# Patient Record
Sex: Female | Born: 1975
Health system: Western US, Academic
[De-identification: ages and names within clinical notes are randomized; demographics above are authoritative.]

## PROBLEM LIST (undated history)

## (undated) DIAGNOSIS — Z23 Encounter for immunization: Secondary | ICD-10-CM

## (undated) DIAGNOSIS — M5020 Other cervical disc displacement, unspecified cervical region: Secondary | ICD-10-CM

## (undated) DIAGNOSIS — F0781 Postconcussional syndrome: Secondary | ICD-10-CM

## (undated) DIAGNOSIS — F32A Depression, unspecified: Secondary | ICD-10-CM

## (undated) DIAGNOSIS — F419 Anxiety disorder, unspecified: Secondary | ICD-10-CM

## (undated) HISTORY — DX: Depression, unspecified: F32.A

## (undated) HISTORY — DX: Anxiety disorder, unspecified: F41.9

---

## 2012-01-18 MED ORDER — AMITRIPTYLINE HCL 25 MG PO TABS
25 MG | ORAL_TABLET | Freq: Every evening | ORAL | Status: DC
Start: 2012-01-18 — End: 2012-01-19

## 2012-01-18 NOTE — Patient Instructions (Addendum)
cyanocobalamine or B12 2000 mcg sublingual daily    Early dumping syndrome -- Early dumping syndrome has a rapid onset, usually within 15 minutes. It is the result of rapid emptying of food into the small bowel. Due to the hyperosmolality of the food, rapid fluid shifts from the plasma into the bowel occur, resulting in hypotension and a sympathetic nervous system response. Patients often present with colicky abdominal pain, diarrhea, nausea, and tachycardia [124].    Patients should avoid foods that are high in simple sugar content and replace them with a diet consisting of high fiber, complex carbohydrate, and protein rich foods. Behavioral modification, such as small, frequent meals, and separating solids from liquid intake by 30 minutes, are also advocated

## 2012-01-18 NOTE — Progress Notes (Deleted)
Rhonda Mcguire is a 36 y.o. female presenting to establish care.  Complaints today include    Was treated by urgent care for vaginitis. Treated with rocephin.  2 days of bactrim  Review of Systems - comprehensive review of systems negative except as noted in HPI    Allergies   Allergen Reactions   ??? Nsaids      Gastric bypass surgery       Past Medical History   Diagnosis Date   ??? Renal calculi        Past Surgical History   Procedure Laterality Date   ??? Cesarean section         Family History   Problem Relation Age of Onset   ??? Asthma Mother    ??? Cancer Father        History     Social History   ??? Marital Status: Married     Spouse Name: N/A     Number of Children: N/A   ??? Years of Education: N/A     Occupational History   ??? Not on file.     Social History Main Topics   ??? Smoking status: Never Smoker    ??? Smokeless tobacco: Never Used   ??? Alcohol Use: 0.0 oz/week   ??? Drug Use: No   ??? Sexually Active: Not on file     Other Topics Concern   ??? Not on file     Social History Narrative   ??? No narrative on file         Current Outpatient Prescriptions on File Prior to Visit   Medication Sig Dispense Refill   ??? Etonogestrel-Ethinyl Estradiol (NUVARING VA) Place  vaginally.       ??? Multiple Vitamin (MULTI-VITAMIN DAILY PO) Take  by mouth.         No current facility-administered medications on file prior to visit.       Filed Vitals:    01/18/12 1307   BP: 122/82   Pulse: 72         Wt Readings from Last 3 Encounters:   01/18/12 170 lb 12.8 oz (77.474 kg)     BP Readings from Last 3 Encounters:   01/18/12 122/82       Gen: well nourished, comfortable  Nose: Normal mucosa, no deformity   Ears: normal canals and TM bilaterally  Mouth/Throat: Oropharynx is clear and moist, and mucous membranes are normal. Normal dentition, no concerning lesions  Eyes: Conjunctivae and extraocular motions are normal. Pupils are equal, round, and reactive to light.  Fundi well visualized bilaterally with no abnormalities.  Optic discs with clear  sharp borders.   Neck: Neck supple. Carotid bruit is not present. No mass and no thyromegaly present.   Lymph: no cervical or supraclavicular adenopathy  Cardiovascular: Normal rate, regular rhythm, normal heart sounds and intact distal pulses.  Exam reveals no gallop and no friction rub.  No murmur heard.  Pulmonary/Chest: Effort normal and breath sounds normal. .  no wheezes, rhonchi or rales.   Abdominal: Soft, non-tender. Bowel sounds and aorta are normal.  no organomegaly, mass or bruit.  No bruits    Musculoskeletal: Normal range of motion, no synovitis.  Neurological: alert and oriented to person, place, and time. normal reflexes. CN 2-12 grossly intact   Coordination normal.   Skin: Skin is warm and dry. There is no rash or erythema.  No suspicious lesions noted.   Psychiatric: normal mood and affect. speech is normal and  behavior is normal. Judgment, cognition and memory are normal.     {Recent labs:19471::"not applicable"}    Assessment and Plan  No diagnosis found.

## 2012-01-19 MED ORDER — AMITRIPTYLINE HCL 25 MG PO TABS
25 MG | ORAL_TABLET | Freq: Every evening | ORAL | Status: DC
Start: 2012-01-19 — End: 2012-02-20

## 2012-01-19 NOTE — Telephone Encounter (Signed)
Per verbal order physician

## 2012-01-19 NOTE — Telephone Encounter (Signed)
Pt said the migraine medication was to be sent to [facility]  [phone]

## 2012-01-19 NOTE — Progress Notes (Signed)
Rhonda Mcguire is a 36 y.o. female presenting to establish care.  Complaints today include    Vaginal pruritus- evaluated urgent care [place] 1 week ago. Treated with rocephin, bactrim.  Used topical treatment at home for yeast.  Pruritus not as bad as it was. No dysuria. No vaginal discharge. No increased urinary frequency.  Found out her husband was cheating.     Diarrhrea, diaphoresis, nausea abdominal cramping- notices it within 15 minutes after eating.  Notices tachycardia too when this happens. S/p rouen Y gastric bypass 2 years ago. Has noticed it more past several months.  Notices if she eats too many carbs or ice cream.  Episodes are brief, but uncomfortable. Does have normal stools at other times. No abdominal pain. No vomiting.  Saw another physician.  Normal labs including TSH, cmp, cbc, vitamin D.  However B12 a little low.  She has not been getting the B12 injections she used to receive.    Depression- she does have some difficulty sleeping, concentrating since finding out about her husband's cheating.  Treated for depression in past. Not interested in medication.  In couple's counseling. Denies HI/SI.  lexapro only medication that ever worked for her.    Headache: Patient presents with headaches. Symptoms began about 10 years ago. Generally, the headaches last about 4 hours and occur several times per week. The headache do not seem to be related to any time of the day. The headaches are usually severe and pounding and are temporal in location.  The patient rates his most severe headaches a 7 on a scale from 1 to 10. Recently, the headaches are stable. School attendance or other daily activities are not affected by the headaches. Precipitating factors include stress. The headaches are usually not preceded by an aura. Associated neurologic symptoms which are present include: decreased physical activity and depression. The patient denies dizziness, loss of balance, muscle weakness, numbness of  extremities, speech difficulties, vision problems and vomiting in the early morning. Other associated symptoms include: nothing pertinent.  Symptoms which are not present include: fever. Home treatment has included maxalt melt with good improvement. Other history includes: nothing pertinent. Family history includes no known family members with significant headaches.      Review of Systems - comprehensive review of systems negative except as noted in HPI    Allergies   Allergen Reactions   ??? Nsaids      Gastric bypass surgery       Past Medical History   Diagnosis Date   ??? Renal calculi    ??? Depression    ??? Gastric bypass status for obesity      rouen-Y 2011   ??? Migraine        Past Surgical History   Procedure Laterality Date   ??? Cesarean section     ??? Roux-en-y gastric bypass         Family History   Problem Relation Age of Onset   ??? Asthma Mother    ??? Cancer Father        History     Social History   ??? Marital Status: Married     Spouse Name: N/A     Number of Children: 1   ??? 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 Smoking status: Never Smoker    ??? Smokeless tobacco: Never Used   ??? Alcohol Use: 0.0 oz/week   ???  Drug Use: No   ??? Sexually Active: Not on file     Other Topics Concern   ??? Not on file     Social History Narrative   ??? No narrative on file         Current Outpatient Prescriptions on File Prior to Visit   Medication Sig Dispense Refill   ??? Etonogestrel-Ethinyl Estradiol (NUVARING VA) Place  vaginally.       ??? Multiple Vitamin (MULTI-VITAMIN DAILY PO) Take  by mouth.       ??? amitriptyline (ELAVIL) 25 MG tablet Take 1 tablet by mouth nightly. To prevent migraines  30 tablet  1     No current facility-administered medications on file prior to visit.       Filed Vitals:    01/18/12 1307   BP: 122/82   Pulse: 72         Wt Readings from Last 3 Encounters:   01/18/12 170 lb 12.8 oz (77.474 kg)     BP Readings from Last 3 Encounters:   01/18/12 122/82        Gen: well nourished, comfortable  Nose: Normal mucosa, no deformity   Ears: normal canals and TM bilaterally  Mouth/Throat: Oropharynx is clear and moist, and mucous membranes are normal. Normal dentition, no concerning lesions  Eyes: Conjunctivae and extraocular motions are normal. Pupils are equal, round, and reactive to light.  Fundi well visualized bilaterally with no abnormalities.  Optic discs with clear sharp borders.   Neck: Neck supple. Carotid bruit is not present. No mass and no thyromegaly present.   Lymph: no cervical or supraclavicular adenopathy  Cardiovascular: Normal rate, regular rhythm, normal heart sounds and intact distal pulses.  Exam reveals no gallop and no friction rub.  No murmur heard.  Pulmonary/Chest: Effort normal and breath sounds normal. .  no wheezes, rhonchi or rales.   Abdominal: Soft, non-tender. Bowel sounds and aorta are normal.  no organomegaly, mass or bruit.  No bruits    Musculoskeletal: Normal range of motion, no synovitis.  Neurological: alert and oriented to person, place, and time. normal reflexes. CN 2-12 grossly intact   Coordination normal.   Skin: Skin is warm and dry. There is no rash or erythema.  No suspicious lesions noted.   Psychiatric: normal mood and affect. speech is normal and behavior is normal. Judgment, cognition and memory are normal.     Lab from prior provider reviewed    Assessment and Plan  1. Flu vaccine need  Flu Vaccine greater than or = 38yo IM   2. Need for Tdap vaccination  Tdap vaccine greater than or equal to 7yo IM   3. Vaginitis  CANCELED: Miscellaneous sendout 1, CANCELED: C. trachomatis / N. gonorrhoeae, DNA probe   4. Migraine     5. B12 deficiency     6. S/P gastric bypass     7. Depression     8. Status post gastric bypass for obesity       [facility]   Contacted.  They were unable to locate urine culture results, wet prep. Urine gc/chlamydia, triple screen vaginal probe collected and sent. Discussed need for RPR, HIV  testing at f/u appt    SL B12 2000 mcg daily for vit B12 def    Advised individual counseling as well as couples for her depression.  She declines pharmacotherapy    amitryptiline 25 mg nightly for prophylaxis.  maxalt as abortive.  Use no more than twice weekly.  Avoid use of any analgesic more than twice weekly to prevent rebound HA's.    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 Behavioral modification, such as small, frequent meals, and separating solids from liquid intake by 30 minutes, are also advocated     F/u 1 mth.  Will need RPR, HIV, repeat B12.    Obtain copy of pap For women

## 2012-01-20 LAB — VAGINAL PATHOGENS PROBE *A
Candida Species, DNA Probe: NEGATIVE
Gardnerella Vaginalis, DNA Probe: NEGATIVE
Trichomonas Vaginalis DNA: NEGATIVE

## 2012-02-05 NOTE — Telephone Encounter (Signed)
Records received for women

## 2012-02-20 NOTE — Progress Notes (Signed)
Rhonda Mcguire is a 36 y.o. female presenting today with c/o    Depression- she does have some difficulty sleeping, concentrating since finding out about her husband's cheating. Treated for depression in past. Not interested in medication. In couple's counseling. Denies HI/SI. lexapro only medication that ever worked for her. Husband had been unfaithful. Urine STD screen neg.  Has not had blood work.  Not interested in medication at this point.    Headaches- much less frequent.  Did not tolerate amitriptyline. Using maxalt less than once weekly.    Low B12- blood work performed and scanned by former provider.  Taking otc B12    Review of Systems - comprehensive review of systems negative except as noted in HPI    Allergies   Allergen Reactions   ??? Nsaids      Gastric bypass surgery       Past Medical History   Diagnosis Date   ??? Renal calculi    ??? Depression    ??? Gastric bypass status for obesity      rouen-Y 2011   ??? Migraine        Past Surgical History   Procedure Laterality Date   ??? Cesarean section     ??? Roux-en-y gastric bypass         Family History   Problem Relation Age of Onset   ??? Asthma Mother    ??? Cancer Father        History     Social History   ??? Marital Status: Married     Spouse Name: N/A     Number of Children: 1   ??? 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 Smoking status: Never Smoker    ??? Smokeless tobacco: Never Used   ??? Alcohol Use: 0.0 oz/week   ??? Drug Use: No   ??? Sexually Active: Not on file     Other Topics Concern   ??? Not on file     Social History Narrative   ??? No narrative on file         Current Outpatient Prescriptions on File Prior to Visit   Medication Sig Dispense Refill   ??? Etonogestrel-Ethinyl Estradiol (NUVARING VA) Place  vaginally.       ??? Multiple Vitamin (MULTI-VITAMIN DAILY PO) Take  by mouth.         No current facility-administered medications on file prior to visit.       Filed Vitals:    02/20/12 0743   BP: 144/82    Pulse: 88         Wt Readings from Last 3 Encounters:   02/20/12 169 lb 3.2 oz (76.749 kg)   01/18/12 170 lb 12.8 oz (77.474 kg)     BP Readings from Last 3 Encounters:   02/20/12 144/82   01/18/12 122/82         BP 130/82   Pulse 88   Wt 169 lb 3.2 oz (76.749 kg)   BMI 28.61 kg/m2  General appearance: alert, appears stated age and cooperative  Lungs: clear to auscultation bilaterally  Heart: regular rate and rhythm, S1, S2 normal, no murmur, click, rub or gallop  Abdomen: soft, non-tender; bowel sounds normal; no masses,  no organomegaly  Neurologic: Alert and oriented X 3, normal strength and tone. Normal symmetric reflexes. Normal coordination and gait  Skin: Skin is warm and  dry. .   Psychiatric: normal mood and affect. speech is normal and behavior is normal. Judgment, cognition and memory are normal.     not applicable    Assessment and Plan  1. B12 deficiency  VITAMIN B12   2. Screen for STD (sexually transmitted disease)  RPR, HIV-1 AND HIV-2 ANTIBODIES     Check B12 today.  If ok, continue 2000 mcg SL daily    Call directly on cell at [phone] with results    F/u 1 year

## 2012-02-20 NOTE — Progress Notes (Signed)
Are you taking prescription, herbal, or over the counter medications to help with your mood ?  No    Are you having any side effects with your medication?  No    If on medications, are you taking your medications daily?  Yes    Are you working with a psychologist / psychiatrist?  No    Have you felt your symptoms are better, worse, or unchanged since your last visit   unchanged    Do you want to discuss changes to your treatment? Yes

## 2012-02-21 LAB — RPR: RPR: NONREACTIVE

## 2012-02-21 LAB — VITAMIN B12: Vitamin B-12: 532 pg/mL (ref 211–911)

## 2012-02-21 LAB — HIV SCREEN: HIV-1/HIV-2 Ab: NONREACTIVE

## 2012-06-24 NOTE — Other (Unsigned)
[facility] Emergency Department ED PATIENT ENCOUNTER ARRIVAL: 06/24/12   1059 CSN: [id number]     HAR: [id number]             EMERGENCY DEPARTMENT - General NOTE     CHIEF COMPLAINT Chief Complaint Patient presents with   Syncope   Facial   Injury     HPI The nurses note was reviewed, agreed and appreciated.     Rhonda Mcguire is a 37 year old female with a past medical history   significant for gastric bypass presents with a one-day history of   lightheadedness and ataxia which resulted in a fasting standing height causing   acute contusion to the nasal bone and frontal forehead     Patient denies any recent history of fever, chills, nausea vomiting she   denies any chest pain or shortness of breath denies abdominal pain flank   tenderness dysuria change in her bowel bladder     She complains only of some recent lightheadedness which caused her to be   unsteady of gait resulting in a fall and injury to her nose with point of   injury epistaxis which is now hemostatic.     On exam she is otherwise well-nourished well-developed 37 year old female GCS   of 15 hemodynamically stable in mild distress secondary to fall from standing   height with injury to her nasal bone with epistaxis as described above     PAST MEDICAL HISTORY No past medical history on file.     SURGICAL HISTORY Past Surgical History Procedure Laterality Date   Gastric   bypass   C-section     CURRENT MEDICATIONS Prior to Admission medications Medication Sig Start Date   End Date Taking? Authorizing Provider Etonogestrel-Ethinyl Estradiol (NUVARING   VA) Insert  vaginally.   Yes Med, Historical Multiple Vitamins-Minerals   (MULTIVITAMIN PO) Take  by mouth.   Yes Med, Historical rizatriptan   (MAXALT-MLT) 10 MG TBDP Take 10 mg by mouth once as needed.   Yes Med,   Historical     ALLERGIES Allergies no known allergies     FAMILY HISTORY No family history on file.     SOCIAL HISTORY History     Social History   Marital Status: Married   Spouse  Name: N/A   Number of   Children: N/A   Years of Education: N/A     Social History Main Topics   Smoking status: Never Smoker   Smokeless   tobacco: None   Alcohol Use: No   Drug Use: No   Sexually Active: None     Other Topics Concern   None     Social History Narrative   None     REVIEW OF SYSTEMS Constitutional:  Denies fever, chills, weight loss or   weakness Eyes:  Denies photophobia or discharge HENT:  Denies sore throat or   ear pain Respiratory:  Denies cough or shortness of breath Cardiovascular:    Denies chest pain, palpitations or swelling GI:  Denies abdominal pain,   nausea, vomiting, or diarrhea Musculoskeletal: Nose and frontal forehead pain   secondary to fall from standing height Skin:  Denies rash Neurologic:   Complains of headache and generalized weakness but no other significant   sensory changes Endocrine:  Denies polyuria or polydypsia Lymphatic:  Denies   swollen glands Psychiatric:  Denies depression, suicidal ideation or homicidal   ideation All systems negative except as marked.     PHYSICAL EXAM VITAL  SIGNS: BP 138/89   Pulse 87   Temp(Src) 98.4  F (36.9  C)     Resp 16   SpO2 98%   LMP 06/20/2012 Constitutional:  Well developed, Well   nourished, No acute distress, Non-toxic appearance. HENT:  Normocephalic,   Atraumatic, Bilateral external ears normal, the TMs are clear and pearly   white, Oropharynx moist, No oral exudates, Nose normal. Neck- Normal range of   motion, No tenderness, Supple, No stridor. Eyes:  PERRL, EOMI, Conjunctiva   normal, No discharge,  No injection and no evidence of icterus Respiratory:     Lungs are clear to auscultation bilaterally, no adventitious sounds are   appreciated and there is good equal rise and fall with chest Cardiovascular:     Regular rate and rhythm, S1-S2 without murmurs gallops or rubs GI:  Bowel   sounds normal, Soft, No tenderness, No masses, No pulsatile masses. GU:   Deferred Musculoskeletal:  Intact distal pulses, mild generalized edema  to the   bridge of the nose secondary to fall from standing height with epistaxis and   cutaneous abrasion. Integument:  Warm, Dry, No erythema, No rash. Lymphatic:    No lymphadenopathy noted. Neurologic:  Alert & oriented x 3, Normal motor   function, Normal sensory function, No focal deficits noted. Psychiatric:    Affect normal, Judgment normal, Mood normal.     LABORATORY Recent Results (from the past 24 hour(s)) ECG 12-LEAD  Collection   Time   06/24/12 11:38 AM     Ellarie Picking Value Range  RR INTERVAL 625  PR INTERVAL   200  QRSD INTERVAL 70  QT INTERVAL 360  QTC INTERVAL 455  HEART RATE 96  P   AXIS 62  QRS AXIS 15  T WAVE AXIS 46  I:40 AXIS -13  T:40 AXIS 12  ST AXIS -5    EKG IMPRESSION - BORDERLINE ECG -  INTERPRETING PHYS  STUDY DATE / TIME   2012-06-24 11:38:11 CBC WITH DIFFERENTIAL  Collection Time   06/24/12 11:47 AM       Kendarrius Tanzi Value Range  WBC 10.3  3.6 - 10.5 THOU/mcL  RBC 5.15  3.80 - 5.20   MIL/mcL  HEMOGLOBIN 11.7 (*) 12.0 - 15.2 g/dL  HEMATOCRIT 36  36 - 46 %  MCV   71 (*) 82 - 97 fL  MCH 23 (*) 27 - 33 pg  MCHC 32  32 - 36 g/dL  RDW 09.815.2    <11.9<15.3 %  PLATELET 352  140 - 375 THOU/mcL  ABS. NEUTROPHIL 7.88 (*) 1.80 -   7.70 THOU/mcL  ABS LYMPHS 1.90  1.00 - 4.00 THOU/mcL  ABS MONOS 0.44  0.20 -   0.90 THOU/mcL  ABS EOS 0.05  0.03 - 0.45 THOU/mcL  ABS BASOS 0.04  0.01 - 0.07   THOU/mcL  SEGS 76  LYMPHOCYTES 19  MONOCYTES 4  EOSINOPHIL 1  BASOPHILS 0   PROTIME & INR  Collection Time   06/24/12 11:47 AM     Deontray Hunnicutt Value Range    PROTIME 10.0  9.0 - 11.4 Seconds  INR 1.0  0.8 - 1.2 PTT  Collection Time     06/24/12 11:47 AM     Jenin Birdsall Value Range  PTT 24.5  23.0 - 32.5 Seconds CK AND   MB FRACTION  Collection Time   06/24/12 11:47 AM     Stokes Rattigan Value Range  CPK   55  0 - 200 IU/L  CPK-MB 0.5  <  6.1 ng/mL  CK-MB INDEX    <5  Value: Index is   not calculated when the CPK is less than 150. POCT TROPONIN - ISTAT    Collection Time   06/24/12 11:51 AM     Gilmar Bua Value Range  TROPONIN 0.00    0.00 - 0.05 ng/mL  POCT METABOLIC PANEL - ISTAT  Collection Time   06/24/12   11:53 AM     Jorma Tassinari Value Range  GLUCOSE 93  74 - 99 mg/dL  BLD UREA NITROGEN   12  8 - 26 mg/dL  CREATININE 0.7  0.5 - 1.2 mg/dL  SODIUM 562  130 - 865 mEq/L    POTASSIUM 4.5  3.6 - 5.1 mEq/L  CHLORIDE 107  101 - 111 mEq/L  CO2 TOTAL 26    24 - 36 mEq/L  IONIZED CALCIUM 4.7  4.6 - 5.4 mg/dL  ANION GAP 14  6 - 18    HEMOGLOBIN,CALC 13.3  12.0 - 15.0 g/dL  HEMATOCRIT 39  36 - 47 %PCV     RADIOLOGY XR CHEST PA AND LATERAL  Final Maddyn Lieurance:   There are no acute   cardiopulmonary findings.         CT HEAD WO CONTRAST  Final Tomoya Ringwald:      1.  No acute intracranial hemorrhage.  2. Extracranial soft tissue contusion   right frontal region. No underlying calvarial fracture.  3. Nondisplaced,   non-distracted, fracture involving the anterior tip of the nasal bone with   minimal inferior angulation.         XR NASAL BONES  Final Mar Zettler:      Fracture tip of nasal bone, anteriorly         ED COURSE & MEDICAL DECISION MAKING      On exam she is otherwise well-nourished well-developed 37 year old female   GCS of 15 hemodynamically stable in mild distress secondary to fall from   standing height with injury to her nasal bone with epistaxis as described   above     Laboratory studies demonstrated no specific etiology of her symptoms     Noncontrast head CT demonstrated no acute intrapulmonary process     X-ray studies of the nasal bone demonstrated a fracture of the distal tip of   the nasal bone     Patient will be discharged home with prescription pain medications to manage   ongoing pain and nausea additionally she'll be instructed to follow up with   her primary care provider as well as the ENT specialist for which she will be   referred to     I discussed the plan with the patient who is in agreement and will follow the   discharge instructions as outlined above. I have answered all of their   questions and left the department ambulatory and in good condition.     The  patient was given the following medication in the Emergency department:     Medication Administration from 06/24/2012 1059 to 06/24/2012 1322      Date/Time Order Dose Route Action Action by Comments   06/24/2012 1253   morphine injection 4 mg 4 mg Intravenous Given Retta Diones   06/24/2012   1253 ondansetron (ZOFRAN) injection 4 mg 4 mg Intravenous Given Retta Diones         FINAL DIAGNOSIS     1. Closed fracture of nasal bone 2. Lightheadedness 3. Lack of coordination   4. Syncopal episodes  The patient was given the following medications to go home with.     New Prescriptions  ONDANSETRON (ZOFRAN) 4 MG TABS    Take 1 tablet by mouth   every 8 (eight) hours as needed.  OXYCODONE-ACETAMINOPHEN (PERCOCET) 5-325 MG   TABS    Take 1-2 tablets by mouth every 6 (six) hours as needed.     Follow-up Information  Follow up With Details Comments Contact Info  Abbott,   Cecile Hearing., MD Schedule an appointment as soon as possible for a visit in 3 days    [address] [facility] [address] [phone]      [facility]ena [facility] Th[facility] Call  [address] [place] [facility]   [id number] [phone]         Electronically signed by: Ivar Bury PA-C     Schrichten, Quita Skye., MD spent face-to-face time with the patient and agrees   with the management and disposition         Ivar Bury, [place] 06/24/12 1322         _________________________________  Signed by:    Ivar Bury    10    D: 06/24/2012 01:22 PM  T: 06/24/2012 01:22 PM    This document is confidential medical information.  Unauthorized disclosure or   use of this information is prohibited by law.  If you are not the intended recipient of this document, please advise [place] by   calling immediately [phone].

## 2012-06-24 NOTE — Other (Unsigned)
CURRENT STATUS IS EMERGENCY PENDING CARE MANAGEMENT DESIGNATION.     Any questions regarding MEDICAL RECORDS should be directed to Medical   Records: [facility] [phone] or [facility]   [phone]. Any questions regarding BILLING should be directed to the Care   Management Departments: [facility] [phone] or [facility] [phone].         _________________________________  Signed byJamison Neighbor:    [facility]  NOTIFY    ZZ    D: 06/24/2012 10:59 AM  T:    This document is confidential medical information.  Unauthorized disclosure or   use of this information is prohibited by law.  If you are not the intended recipient of this document, please advise us by   calling immediately [phone].

## 2012-06-24 NOTE — Other (Unsigned)
EMERGENCY DEPARTMENT - ATTENDING NOTE     I did examine the patient's face.     I have personally seen and examined this patient. I have fully participated   in the care of this patient. I have reviewed and agree with all pertinent   clinical information including history, physical exam, labs, radiographic   studies and the plan. I have also reviewed and agree with the medications,   allergies and past medical history sections for this patient.         Electronically signed by:     Quita SkyeJames D. Schrichten, MD         Laurence SlateSchrichten, James D., MD 06/24/12 1537         _________________________________  Signed by:    Quita SkyeJAMES D. SCHRICHTEN    SC    D: 06/24/2012 03:37 PM  T: 06/24/2012 03:37 PM    This document is confidential medical information.  Unauthorized disclosure or   use of this information is prohibited by law.  If you are not the intended recipient of this document, please advise us by   calling immediately [phone].

## 2012-06-27 NOTE — Progress Notes (Signed)
 CHIEF COMPLAINT: Larey Seat, struck face    HISTORY OF PRESENT ILLNESS:  37 y.o. female who struck her face 4 days ago.  Seen at ED [facility]  No nasal obstruction.  No diplopea.  Dental occlusion unchanged.  No smoking.      PAST MEDICAL HISTORY:   History   Smoking status   . Never Smoker    Smokeless tobacco   . Never Used                                                    History   Alcohol Use   . 0.0 oz/week                                                  Current outpatient prescriptions:oxyCODONE-acetaminophen (PERCOCET) 7.5-325 MG per tablet, Take 1 tablet by mouth every 4 hours as needed for Pain., Disp: , Rfl: ;  Etonogestrel-Ethinyl Estradiol (NUVARING VA), Place  vaginally., Disp: , Rfl: ;  Multiple Vitamin (MULTI-VITAMIN DAILY PO), Take  by mouth., Disp: , Rfl:                                                  Past Medical History   Diagnosis Date   . Renal calculi    . Depression    . Gastric bypass status for obesity      rouen-Y 2011   . Migraine                                                     Past Surgical History   Procedure Laterality Date   . Cesarean section     . Roux-en-y gastric bypass             PHYSICAL EXAMINATION:   GENERAL: wdwn- no acute distress  COMMUNICATION :  Normal voice  MENTAL STATUS:  Normal  HEAD AND FACE:  Left orbital hematoma  EXTERNAL EARS AND NOSE:  Healing laceration left nasal dorsum  FACIAL MUSCLES:  Normal  EXTRAOCULAR MUSCLES: Intact.  Left subconjuctival hemorrhage  FACE PALPATION:  Zygomatic arches and orbital rims intact  OTOSCOPY:  Normal tympanic membranes and middle ear spaces  TUNING FORKS: Rinne ++ Weber midline at 512 Hz  INTRANASAL:  Septum midline, no intranasal hematomata, turbinates normal, meati clear.  LIPS, TEETH, GINGIVA:  Normal mucosa  PHARYNX:  Normal  NECK:  No masses or adenopathy  SALIVARY GLANDS:  Normal  THYROID:  Normal  CRANIAL NERVES:  All trigeminal branches intact bilateral.  IMAGING REVIEWED:  Minimal displaced nasal tip  fracture    IMPRESSION: Facial trauma.  No displaced fracture.      PLAN: Reassured. No intervention indicated    FOLLOW-UP: prn

## 2012-06-27 NOTE — Patient Instructions (Signed)
 You may follow up as needed

## 2012-06-27 NOTE — Progress Notes (Signed)
Rhonda Mcguire is a 37 y.o. female presenting today with c/o    Was in the bath tub Monday morning.  Stood up and felt dizzy, passing out striking her face on the commode.  Still having left sided HA's. Difficulty concentrating in class.  Taking percocet for the HA's.  Hx of migraines.  CT, facial xray from ER reviewed. Nasal fracture.  No nausea or vomiting. No confusion.  Just left sided HA, dizziness, more so when she tries to hard to concentrate.  Has not tried her migraine medication for her HA.  No runny nose, no ear drainage. Sleeping ok. No irritability. No loss of balance or vertigo.  Bright lights bother her HA also.      Review of Systems - comprehensive review of systems negative except as noted in HPI    Allergies   Allergen Reactions   ??? Nsaids      Gastric bypass surgery       Past Medical History   Diagnosis Date   ??? Renal calculi    ??? Depression    ??? Gastric bypass status for obesity      rouen-Y 2011   ??? Migraine        Past Surgical History   Procedure Laterality Date   ??? Cesarean section     ??? Roux-en-y gastric bypass         Family History   Problem Relation Age of Onset   ??? Asthma Mother    ??? Cancer Father        History     Social History   ??? Marital Status: Married     Spouse Name: N/A     Number of Children: 1   ??? 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 Smoking status: Never Smoker    ??? Smokeless tobacco: Never Used   ??? Alcohol Use: 0.0 oz/week   ??? Drug Use: No   ??? Sexually Active: Not on file     Other Topics Concern   ??? Not on file     Social History Narrative   ??? No narrative on file         Current Outpatient Prescriptions on File Prior to Visit   Medication Sig Dispense Refill   ??? Etonogestrel-Ethinyl Estradiol (NUVARING VA) Place  vaginally.       ??? Multiple Vitamin (MULTI-VITAMIN DAILY PO) Take  by mouth.         No current facility-administered medications on file prior to visit.       Filed Vitals:    06/27/12 1139   BP:  130/80   Pulse: 80         Wt Readings from Last 3 Encounters:   06/27/12 174 lb (78.926 kg)   02/20/12 169 lb 3.2 oz (76.749 kg)   01/18/12 170 lb 12.8 oz (77.474 kg)     BP Readings from Last 3 Encounters:   06/27/12 130/80   02/20/12 130/82   01/18/12 122/82         BP 130/80   Pulse 80   Wt 174 lb (78.926 kg)   BMI 29.42 kg/m2  General appearance: alert, appears stated age and no distress  Eyes: left lateral subconjunctival hemorrhage. Ecchymosis left face, eye.  PERRLA  Ears: normal TM's and external ear canals both ears  Nose: swollen left side with bruising, slight lateral displacement of  distal nose. No obvious septal hematoma on inspection  Lungs: clear to auscultation bilaterally  Heart: regular rate and rhythm, S1, S2 normal, no murmur, click, rub or gallop  Neurologic: Alert and oriented X 3, normal strength and tone. Normal symmetric reflexes. Normal coordination and gait  Skin: Skin is warm and dry. Marland Kitchen   Psychiatric: normal mood and affect. speech is normal and behavior is normal. Judgment, cognition and memory are normal.     not applicable    Assessment and Plan  1. Nasal fracture  [place]- Sheria Lang, MD   2. Post-concussion headache     3. Subconjunctival hemorrhage, traumatic       Discussed HA, decreased concentration common with concussion  Note given for school.  Mental and physical rest  Advised judicious use of the percocet.  Would try maxalt for HA component with hx of migraines  Expect resolution of sx typically within 7-10 days, although post concussive syndrome possible  Will f/u next week. If still with HA's consider prophylaxis for migraines  appt arranged with Dr. Milda Smart today. [facility] referral initiated.

## 2012-07-01 MED ORDER — COENZYME Q10 100 MG PO CHEW
100 MG | ORAL_TABLET | ORAL | Status: AC
Start: 2012-07-01 — End: ?

## 2012-07-01 MED ORDER — MAGNESIUM OXIDE 400 MG PO TABS
400 MG | ORAL_TABLET | Freq: Every day | ORAL | Status: DC
Start: 2012-07-01 — End: 2012-07-02

## 2012-07-01 MED ORDER — TOPIRAMATE 100 MG PO TABS
100 MG | ORAL_TABLET | Freq: Every evening | ORAL | Status: DC
Start: 2012-07-01 — End: 2012-10-17

## 2012-07-01 MED ORDER — RIBOFLAVIN 400 MG PO CAPS
400 MG | ORAL_CAPSULE | ORAL | Status: AC
Start: 2012-07-01 — End: ?

## 2012-07-01 MED ADMIN — ketorolac (TORADOL) injection 60 mg: 60 mg | INTRAVENOUS | @ 14:00:00 | NDC 00409379501

## 2012-07-01 NOTE — Progress Notes (Signed)
Rhonda Mcguire is a 37 y.o. female presenting today with c/o    Hx of migraines in past 4 years.  Stress is trigger. Can occur 4 times weekly when stressed.   She has scotoma.  Start unilateral and then become bilateral. Last 3 hours.  Struck her face during syncopal  Episode 1 week ago. Has had facial pain and HA's since.   Typically has at least 1 headache weekly.  Squeezing, pounding.  Lying in dark room, maxalt help.  Sometimes nausea. No vomiting.  Photophobia, phonophobia.  Can't take ibuprofen due to hx of gastric bypass.  Was seen in ER. Distal nasal fracture. Followed up by ENT-no treatment needed.  Denies improvement in sx since last week when event occurred. In school and unable to concentrate due to worsening HA's since concussion 1 week ago. Hx of depression, but denies currently.     Review of Systems - comprehensive review of systems negative except as noted in HPI    Allergies   Allergen Reactions   ??? Nsaids      Gastric bypass surgery       Past Medical History   Diagnosis Date   ??? Renal calculi    ??? Depression    ??? Gastric bypass status for obesity      rouen-Y 2011   ??? Migraine        Past Surgical History   Procedure Laterality Date   ??? Cesarean section     ??? Roux-en-y gastric bypass         Family History   Problem Relation Age of Onset   ??? Asthma Mother    ??? Cancer Father        History     Social History   ??? Marital Status: Married     Spouse Name: N/A     Number of Children: 1   ??? 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 Smoking status: Never Smoker    ??? Smokeless tobacco: Never Used   ??? Alcohol Use: 0.0 oz/week   ??? Drug Use: No   ??? Sexually Active: Not on file     Other Topics Concern   ??? Not on file     Social History Narrative   ??? No narrative on file         Current Outpatient Prescriptions on File Prior to Visit   Medication Sig Dispense Refill   ??? Etonogestrel-Ethinyl Estradiol (NUVARING VA) Place  vaginally.       ??? Multiple  Vitamin (MULTI-VITAMIN DAILY PO) Take  by mouth.         No current facility-administered medications on file prior to visit.       Filed Vitals:    07/01/12 0853   BP: 130/82   Pulse: 78         Wt Readings from Last 3 Encounters:   07/01/12 174 lb (78.926 kg)   06/27/12 173 lb (78.472 kg)   06/27/12 174 lb (78.926 kg)     BP Readings from Last 3 Encounters:   07/01/12 130/82   06/27/12 128/66   06/27/12 130/80         BP 130/82   Pulse 78   Wt 174 lb (78.926 kg)   BMI 29.85 kg/m2   LMP 06/24/2012  General appearance: alert, appears stated age and no distress  Eyes: negative findings: lids and  lashes normal, corneas clear, pupils equal, round, reactive to light and accomodation and fundi normal, resolving subconjunctival hemorrhage right lateral eye  Ears: normal TM's and external ear canals both ears  Nose: turbinates normal, swelling resolved  Throat: lips, mucosa, and tongue normal; teeth and gums normal  Neck: no adenopathy, thyroid not enlarged, symmetric, no tenderness/mass/nodules and normal ROM, supple  Neurologic: Alert and oriented X 3, normal strength and tone. Normal symmetric reflexes. Normal coordination and gait  Skin: Skin is warm and dry. Marland Kitchen   Psychiatric: flat mood and affect. speech is normal and behavior is normal. Judgment, cognition and memory are normal.     not applicable    Assessment and Plan  1. Tension headache, chronic  [place]- Otelia Sergeant, MD    [place], [place]    CANCELED: [facility] - Contractor, Angelina Pih, MD   2. 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 Postconcussive syndrome  [place]- Otelia Sergeant, MD    [place], [place]    CANCELED: [facility] - Contractor, [place], MD     Reports long hx of migraines complicated by recent concussion and now, postconcussive syndrome.  Did not tolerate amitriptyline in past-SE of confusion    Agreeable  to trying topomax as prophylactic.  60 mg toradol IM given in office  Riboflavin, magnesium , CoQ, adequate hydration.  Will ask for help from neuro, Dr. Jason Fila. appt arranged in 3 weeks.    Not for school with post concussive syndrome. Mental and physical rest for now. May end up needing neurocognitive testing if sx persist    F/u 1 mth with as well. Denies depression, but always has flat affect

## 2012-07-01 NOTE — Patient Instructions (Addendum)
Riboflavin or vitamin B2 400 mg daily  Coenzyme Q 100 mg 3 times daily  Magnesium 400 mg daily

## 2012-07-18 MED ORDER — TIZANIDINE HCL 4 MG PO TABS
4 MG | ORAL_TABLET | Freq: Every evening | ORAL | Status: DC
Start: 2012-07-18 — End: 2012-12-12

## 2012-07-18 NOTE — Progress Notes (Signed)
The patient was referred by Su Monks, MD  for consultation regarding postconcussion syndrome, and daily headaches, and chronic migraine.    HPI:  The patient was exposed to a brief syncopal episode at home three weeks ago. At that time she was taking a shower and fell and hit the left side of her head. She blacked out for a few seconds. The patient was seen and evaluated at [facility] where she had CT scan of the head. She was discharged home. From the accident, she sustained mild nasal fracture and trauma.    Since that incident, she describes daily headaches. Her headaches are dull ache and pressure feeling that can affect her whole head. They range from moderate to mild. Her headaches are daily and persistent. No clear triggers. No nausea or vomiting. No weakness or numbness. She hasn't been taking any rescue medications for these headaches. She has history of migraines and she feels her current headache are different from her chronic migraine.    She describes her migraines as unilateral or bilateral pulsating and sharp pain. She can have them once a week. They last for few hours. She does describe visual aura with these migraines, photophobia, and phonophobia. No family history of migraine. No nausea or vomiting. No weakness or numbness. She takes Maxalt p.r.n. For rescue once a week.  A month ago she was started on Topamax and now she is up 100 mg daily. So far she denies any side effects from such medication. No trials of other preventative medicine. No sleep disturbance, memory loss, worsening depression. Other review of system was unremarkable.    Exam:  Constitutional: Weight: well-nourished.   Head: normocephalic and atraumatic.   Neck:  supple and no carotid bruits.   Heart: Rate And Rhythm: RRR. Murmurs: none.   Mental Status: Orientation oriented to person, place, problem, and time.   Mood/Affect: appropriate mood.   Memory: recent memory intact and remote memory intact.   Cranial  Nerves:  CN 2-12 wnl.   Motor Exam: normal tone, 5/5 strength in all 4 extremities.  Reflexes: Bilateral biceps 2/4, triceps 2/4, brachial radialis 2/4, knee 2/4 and ankle 2/4.   Planters: flexor bilaterally.  Coordination: no pronator drift, no dysmetria, FNF and HS testing within normal limits.  Sensation: normal to all modalities.  Gait/Posture: Normal    ROS: 14 points ROS reviewed with the patient which were normal except as mentioned in my HPI.    I personally reviewed social history, past medical history, medications, allergy, surgical history, and family history as documented in the patient's electronic health records.    Labs and test results: reviewed and discussed with the patient.     Assessment:  #1 post concussion syndrome.  #2 acute post traumatic headache.  #3 chronic migraine with aura.    Plan:  #1 change Topamax to 50 mg p.o. B.i.d.  #2 adequate hydration.  #3 start Zanaflex 4 mg at night for headache prevention. She tried amitriptyline in the past and had some side effects.  #4 continue with Maxalt for migraine only.  #5 no need to repeat neuroimaging.  #6 Will consider increasing Topamax to 75 g p.o. B.i.d. In the next 2-3 weeks.  #7 followup with me in 2 months.      Instructions:  1- The risk of rebound headaches were discussed.  2- Limit the use of any rescue medications to 2 days per week.  3- Limit Sodas and Coffee and increase hydration.  4- Migraine diary.  5- Improving sleep hygiene and avoid triggers.  6- The relation between headaches and mood disorders were addressed.  7- Preventive and rescue medications discussion.

## 2012-09-02 NOTE — Progress Notes (Signed)
History and Physical      Rhonda Mcguire  Date of Birth:  27-Feb-1976    Date of Service:  09/02/2012    Chief Complaint:   Rhonda Mcguire is a 37 y.o. female who presents for complete physical examination.    HPI: physical for PT assistant school    Wt Readings from Last 3 Encounters:   09/02/12 168 lb (76.204 kg)   07/18/12 171 lb (77.565 kg)   07/01/12 174 lb (78.926 kg)     BP Readings from Last 3 Encounters:   09/02/12 118/76   07/18/12 130/80   07/01/12 130/82       Patient Active Problem List   Diagnosis   ??? B12 deficiency   ??? Migraine   ??? Depression   ??? Status post gastric bypass for obesity   ??? Post concussion syndrome   ??? Acute post-traumatic headache   ??? Migraine with aura, with intractable migraine, so stated, without mention of status migrainosus   ??? Syncope and collapse       Preventive Care:  Health Maintenance   Topic Date Due   ??? Flu Vaccine Yearly (Adult)  12/16/2012   ??? Cervical Cancer Screening  11/17/2014   ??? Tetanus Vaccine Adult (11 Years And Up)  01/17/2022      Hx abnormal PAP: no  Good basic nutrition: yes  Self-breast exams: yes    Exercise: walks 4 time(s) per week  Seatbelt use: 100%  Lipid panel:   No results found for this basename: chol, trig, hdl, ldlcalc, ldldirect        Living will:  no,   patient declined    Immunization History   Administered Date(s) Administered   ??? Hepatitis A 03/26/1996   ??? Influenza Virus Vaccine 01/18/2012   ??? Meningococcal Conjugate 05/30/1999   ??? Tdap 01/18/2012       Allergies   Allergen Reactions   ??? Amitriptyline Other (See Comments)     confusion   ??? Nsaids      Gastric bypass surgery     Outpatient Prescriptions Marked as Taking for the 09/02/12 encounter (Office Visit) with Satira Mccallum Hektor Huston, MD   Medication Sig Dispense Refill   ??? Magnesium 400 MG CAPS Take  by mouth.       ??? vitamin B-12 (CYANOCOBALAMIN) 1000 MCG tablet Take 1,000 mcg by mouth daily.       ??? rizatriptan (MAXALT) 5 MG tablet Take 5 mg by mouth once as needed for Migraine. May repeat in 2  hours if needed       ??? tiZANidine (ZANAFLEX) 4 MG tablet Take 1 tablet by mouth nightly.  60 tablet  2   ??? Riboflavin 400 MG CAPS 1 tab po daily  30 capsule  0   ??? Coenzyme Q10 100 MG CHEW 1 tab po tid  90 tablet  0   ??? topiramate (TOPAMAX) 100 MG tablet Take 1 tablet by mouth nightly. To prevent migraines  30 tablet  1   ??? Etonogestrel-Ethinyl Estradiol (NUVARING VA) Place  vaginally.       ??? Multiple Vitamin (MULTI-VITAMIN DAILY PO) Take  by mouth.           Past Medical History   Diagnosis Date   ??? Renal calculi    ??? Depression    ??? Gastric bypass status for obesity      rouen-Y 2011   ??? Migraine      Past Surgical History   Procedure Laterality Date   ???  Cesarean section     ??? Roux-en-y gastric bypass       Family History   Problem Relation Age of Onset   ??? Asthma Mother    ??? Cancer Father      stomach     History     Social History   ??? Marital Status: Married     Spouse Name: N/A     Number of Children: 1   ??? Years of Education: N/A     Occupational History   ??? student-physical therapy assistant      Social History Main Topics   ??? Smoking status: Never Smoker    ??? Smokeless tobacco: Never Used   ??? Alcohol Use: 0.0 oz/week   ??? Drug Use: No   ??? Sexually Active: Not on file     Other Topics Concern   ??? Not on file     Social History Narrative   ??? No narrative on file       Review of Systems:  A comprehensive review of systems was negative except for what was noted in the HPI.     Physical Exam:   Filed Vitals:    09/02/12 0805   BP: 118/76   Pulse: 80   Height: 5\' 4"  (1.626 m)   Weight: 168 lb (76.204 kg)     Body mass index is 28.82 kg/(m^2).   Constitutional: She is oriented to person, place, and time. She appears well-developed and well-nourished. No distress.   HEENT:   Head: Normocephalic and atraumatic.   Right Ear: Tympanic membrane, external ear and ear canal normal.   Left Ear: Tympanic membrane, external ear and ear canal normal.   Nose: Nose normal.   Mouth/Throat: Oropharynx is clear and moist, and  mucous membranes are normal.  There is no cervical adenopathy.  Eyes: Conjunctivae and extraocular motions are normal. Pupils are equal, round, and reactive to light.   Neck: Neck supple. No JVD present. Carotid bruit is not present. No mass and no thyromegaly present.   Cardiovascular: Normal rate, regular rhythm, normal heart sounds and intact distal pulses.  Exam reveals no gallop and no friction rub.  No murmur heard.  Pulmonary/Chest: Effort normal and breath sounds normal. No respiratory distress. She has no wheezes, rhonchi or rales.   Abdominal: Soft, non-tender. Bowel sounds and aorta are normal. She exhibits no organomegaly, mass or bruit.   Genitourinary: performed by gynecologist.  Breast exam:  performed by specialist.  Musculoskeletal: Normal range of motion, no synovitis. She exhibits no edema.   Neurological: She is alert and oriented to person, place, and time. She has normal reflexes. No cranial nerve deficit. Coordination normal.   Skin: Skin is warm and dry. There is no rash or erythema.  No suspicious lesions noted.   Psychiatric: She has a normal mood and affect. Her speech is normal and behavior is normal. Judgment, cognition and memory are normal.         Lab Review: not applicable     Assessment/Plan:    Chundra was seen today for concussion.    Diagnoses and associated orders for this visit:    Routine physical examination  - RUBEOLA ANTIBODY IGG  - RUBELLA ANTIBODY, IGG  - MUMPS ANTIBODY, IGG  - VARICELLA ZOSTER ANTIBODY, IGG  - HEPATITIS B SURFACE ANTIBODY CLIENT  - Lipid panel  - Hepatitis A vaccine adult IM    Screening-pulmonary TB    Other Orders  - Mantoux testing  Commended regular exercise, good basic nutrition  She will work on living will via [employer]  Titers today, ppd. 2 step  Form completed for pick up following 2 nd step ppd read

## 2012-09-03 LAB — LIPID PANEL
Cholesterol, Total: 129 mg/dL (ref 0–199)
HDL: 69 mg/dL — ABNORMAL HIGH (ref 40–60)
LDL Calculated: 44 mg/dL (ref <100)
Triglycerides: 82 mg/dL (ref 0–150)
VLDL Cholesterol Calculated: 16 mg/dL

## 2012-09-03 LAB — RUBELLA ANTIBODY, IGG: Rubella Antibody IgG: 69.2 IU/mL

## 2012-09-04 LAB — MANTOUX TESTING: TB Skin Test: NEGATIVE

## 2012-09-04 LAB — HEPATITIS B SURFACE ANTIBODY CLIENT: Hep B S Ab: 16.37 m[IU]/mL

## 2012-09-04 LAB — RUBEOLA ANTIBODY IGG: Rubeola (Measles) Ab IgG: 227 AU/mL

## 2012-09-04 LAB — VARICELLA ZOSTER ANTIBODY, IGG: Varicella Zoster Ab IgG: 925.4 IV

## 2012-09-04 LAB — MUMPS ANTIBODY, IGG: Mumps IgG: <5 AU/mL

## 2012-09-05 ENCOUNTER — Telehealth

## 2012-09-05 NOTE — Telephone Encounter (Signed)
Left message to call back

## 2012-09-05 NOTE — Telephone Encounter (Signed)
Please let her know her test results reveals she needs an additional MMR. Her mumps titer showed she wasn't still immune. She can get it when she comes back for next ppd.

## 2012-09-18 LAB — MANTOUX TESTING
Induration: 0
TB Skin Test: NEGATIVE

## 2012-09-18 NOTE — Telephone Encounter (Signed)
The 2nd form is [employer] because she was going to have 2nd ppd done at [facility].  Rhonda Mcguire has the form

## 2012-09-18 NOTE — Telephone Encounter (Signed)
Patient came in for PPD and MMR. She said that she gave the forms to the office at [facility] for them to fill out. Please call patient when form is completed. She had a slight local reaction to MMR.

## 2012-09-19 NOTE — Telephone Encounter (Signed)
Pt advised and husband will be picking the form up

## 2012-09-19 NOTE — Telephone Encounter (Signed)
Called pt to let her know that the form was ready to pick up in [place] Form is in envelope with pt name on it

## 2012-10-17 NOTE — Progress Notes (Signed)
The patient came today for follow up regarding postconcussion syndrome, and daily headaches, and chronic migraine.    HPI:  Since her last visit, she feels her headaches are better. No more daily headaches. She takes Zanaflex at night and half tab of Topamax at night. She denies any neck pain, weakness or numbness.   She takes Maxalt for migraines. Her migraines are now are once a week instead of daily. No sleep disturbance, memory loss, worsening depression. Other review of system was unremarkable.    Exam:  Constitutional: Weight: well-nourished.   Head: normocephalic and atraumatic.   Neck:  supple and no carotid bruits.   Heart: Rate And Rhythm: RRR. Murmurs: none.   Mental Status: Orientation oriented to person, place, problem, and time.   Mood/Affect: appropriate mood.   Memory: recent memory intact and remote memory intact.   Cranial Nerves:  CN 2-12 wnl.   Motor Exam: normal tone, 5/5 strength in all 4 extremities.  Reflexes: Bilateral biceps 2/4, triceps 2/4, brachial radialis 2/4, knee 2/4 and ankle 2/4.   Coordination: no pronator drift, no dysmetria  Sensation: normal to all modalities.  Gait/Posture: Normal    ROS: 14 points ROS reviewed with the patient which were normal except as mentioned in my HPI.    I personally reviewed social history, past medical history, medications, allergy, surgical history, and family history as documented in the patient's electronic health records.    Labs and test results: reviewed and discussed with the patient.     Assessment:  #1 post concussion syndrome.  #2 acute post traumatic headache.  #3 chronic migraine with aura. better    Plan:  #1 CW with the same dose of Topamax  #2 adequate hydration.  #3 CW Zanaflex 4 mg at night for headache prevention.  #4 continue with Maxalt for migraine only.  #5 RTC in 6 month.      Instructions:  1- The risk of rebound headaches were discussed.  2- Limit the use of any rescue medications to 2 days per week.  3- Limit Sodas and  Coffee and increase hydration.  4- Migraine diary.  5- Improving sleep hygiene and avoid triggers.  6- The relation between headaches and mood disorders were addressed.  7- Preventive and rescue medications discussion.

## 2012-11-19 MED ORDER — TOPIRAMATE 100 MG PO TABS
100 MG | ORAL_TABLET | ORAL | Status: DC
Start: 2012-11-19 — End: 2013-06-03

## 2012-11-19 NOTE — Telephone Encounter (Signed)
Per physician verbal order

## 2012-12-12 ENCOUNTER — Encounter

## 2012-12-12 MED ORDER — TIZANIDINE HCL 4 MG PO TABS
4 MG | ORAL_TABLET | Freq: Every evening | ORAL | Status: DC
Start: 2012-12-12 — End: 2013-08-01

## 2012-12-12 NOTE — Telephone Encounter (Signed)
Last seen 07.03.14    Last office note states to RTO 6 months/01.2015.    Next appointment scheduled for 01.15.2015.

## 2013-06-03 MED ORDER — TOPIRAMATE 100 MG PO TABS
100 MG | ORAL_TABLET | Freq: Every evening | ORAL | Status: AC
Start: 2013-06-03 — End: ?

## 2013-06-03 NOTE — Progress Notes (Signed)
The patient came today for follow up regarding postconcussion syndrome and chronic migraine.    HPI:  Since her last visit, she denies any new symptoms today. Her migraines are infrequent and can be 1-2 per month. She takes Topamax 50 mg at night. She denies any SE from such medication. She is not using OTC. She takes Zanaflex. She denies any neck pain, weakness or numbness.   She takes Maxalt for migraines for rescue. No sleep disturbance, memory loss, worsening depression. Other review of system was unremarkable.    Exam:  Constitutional: Weight: well-nourished.   Head: normocephalic and atraumatic.   Neck:  supple and no carotid bruits.   Heart: Rate And Rhythm: RRR. Murmurs: none.   Mental Status: Orientation oriented to person, place, problem, and time.   Mood/Affect: appropriate mood.   Memory: recent memory intact and remote memory intact.   Cranial Nerves:  CN 2-12 wnl.   Motor Exam: normal tone, 5/5 strength in all 4 extremities.  Reflexes: Bilateral biceps 2/4, triceps 2/4, brachial radialis 2/4, knee 2/4 and ankle 2/4.   Coordination: no pronator drift, no dysmetria  Sensation: normal to all modalities.  Gait/Posture: Normal    ROS: 14 points ROS reviewed with the patient which were normal except as mentioned in my HPI.    I personally reviewed social history, past medical history, medications, allergy, surgical history, and family history as documented in the patient's electronic health records.    Labs and test results: reviewed and discussed with the patient.     Assessment:  #1 post concussion syndrome.  #2 post traumatic headache.  #3 chronic migraine with aura. better    Plan:  #1 CW with the same dose of Topamax 50 mg at night. Refill   #2 adequate hydration.  #3 CW Zanaflex 4 mg at night for headache prevention.  #4 continue with Maxalt for migraine only.  #5 RTC in 6 month.      Instructions:  1- The risk of rebound headaches were discussed.  2- Limit the use of any rescue medications to 2 days  per week.  3- Limit Sodas and Coffee and increase hydration.  4- Migraine diary.  5- Improving sleep hygiene and avoid triggers.  6- The relation between headaches and mood disorders were addressed.  7- Preventive and rescue medications discussion.

## 2013-07-11 MED ORDER — GABAPENTIN 300 MG PO CAPS
300 MG | ORAL_CAPSULE | ORAL | Status: DC
Start: 2013-07-11 — End: 2013-07-17

## 2013-07-11 NOTE — Progress Notes (Signed)
Rhonda Mcguire is a 38 y.o. female presenting today with c/o    Shoulder Pain: Patient complaints of left shoulder pain. This is evaluated as a personal injury. The pain is described as aching, burning and shooting.  The onset of the pain was sudden, not related to any specific activity.  The pain occurs every day and is continuous.  Location is global, neck, trapezius, radiates down arm. . No history of dislocation. Symptoms are aggravated by any movement. . Symptoms are diminished by  Nothing..   Limited activities include: most. Hurts to do anything. . No stiffness is reported. 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 Has had neck pain.  7/10 pain intermittently last 3 days.  Crutches make it worse.  Taking tylenol for the pain. 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 Has been working with crutches.     Review of Systems - comprehensive review of systems negative except as noted in HPI    Allergies   Allergen Reactions   ??? Amitriptyline Other (See Comments)     confusion   ??? Nsaids      Gastric bypass surgery       Past Medical History   Diagnosis Date   ??? Renal calculi    ??? Depression    ??? Gastric bypass status for obesity      rouen-Y 2011   ??? Migraine        Past Surgical History   Procedure Laterality Date   ??? Cesarean section     ??? Roux-en-y gastric bypass         Family History   Problem Relation Age of Onset   ??? Asthma Mother    ??? Cancer Father      stomach       History     Social History   ??? Marital Status: Married     Spouse Name: N/A     Number of Children: 1   ??? Years of Education: N/A     Occupational History   ??? student-physical therapy assistant      Social History Main Topics   ??? Smoking status: Never Smoker    ??? Smokeless tobacco: Never Used   ??? Alcohol Use: 0.0 oz/week   ??? Drug Use: No   ??? Sexual Activity: Not on file     Other Topics Concern   ??? Not on file     Social History Narrative         Current Outpatient Prescriptions on File Prior to Visit    Medication Sig Dispense Refill   ??? topiramate (TOPAMAX) 100 MG tablet Take 0.5 tablets by mouth nightly.  90 tablet  3   ??? tiZANidine (ZANAFLEX) 4 MG tablet Take 1 tablet by mouth nightly.  90 tablet  2   ??? Magnesium 400 MG CAPS Take  by mouth.       ??? vitamin B-12 (CYANOCOBALAMIN) 1000 MCG tablet Take 1,000 mcg by mouth daily.       ??? rizatriptan (MAXALT) 5 MG tablet Take 5 mg by mouth once as needed for Migraine. May repeat in 2 hours if needed       ??? Riboflavin 400 MG CAPS 1 tab po daily  30 capsule  0   ??? Coenzyme Q10 100 MG CHEW 1 tab po tid  90 tablet  0   ??? Etonogestrel-Ethinyl Estradiol (NUVARING VA) Place  vaginally.       ??? Multiple  Vitamin (MULTI-VITAMIN DAILY PO) Take  by mouth.         No current facility-administered medications on file prior to visit.       Filed Vitals:    07/11/13 1327   BP: 154/90         Wt Readings from Last 3 Encounters:   07/11/13 162 lb 6.4 oz (73.664 kg)   06/03/13 164 lb (74.39 kg)   10/17/12 162 lb (73.483 kg)     BP Readings from Last 3 Encounters:   07/11/13 154/90   06/03/13 138/80   10/17/12 140/88         BP 140/90    Ht 5\' 4"  (1.626 m)    Wt 162 lb 6.4 oz (73.664 kg)    BMI 27.86 kg/m2     General appearance: alert, appears stated age and no distress  Neck: no adenopathy, thyroid not enlarged, symmetric, no tenderness/mass/nodules and normal ROM  Back: symmetric, no curvature. ROM normal. No CVA tenderness. mild tenderness to palpation left trapezius muscle  Pulses: 2+ and symmetric  Neurologic: Alert and oriented X 3, normal strength and tone. Normal symmetric reflexes. Normal coordination and gait   Msk: negative hawkins, neer. No signs of impingement. Full ROM both shoulders  Skin: Skin is warm and dry. Marland Kitchen   Psychiatric: normal mood and affect. speech is normal and behavior is normal. Judgment, cognition and memory are normal.       Assessment and Plan  1. 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 Already on muscle relaxer  Gabapentin  for neuropathic pain  Consider brachial plexus injury with crutch use for clinicals, consider cervical radiculopathy  F/u 3 weeks with me  - EMG; Future    2. Vision problems  Reports needs vision testing per neurologist on muscle relaxer  20/25 bilaterally in office today

## 2013-07-11 NOTE — Patient Instructions (Signed)
Call [phone] to [facility]

## 2013-07-13 NOTE — Other (Unsigned)
[facility] Emergency Department ED PATIENT ENCOUNTER ARRIVAL:   07/13/13 1708 CSN: [id number]     HAR: [id number] EMERGENCY DEPARTMENT - NECK   NOTE     CHIEF COMPLAINT Chief Complaint Patient presents with   Arm Problem   Pt has   had a burning sensation in her left arm for the last two weeks, getting worse,   seen her PCP on Friday et was given a prescription, medication not helping,   has an appointment 4/14 for nerve conduction study     HPI Rhonda Mcguire is a 38 year old female who presents With left arm pain.   Patient states the past 2 weeks she's been having a burning sensation which is   discharged in her left wrist on the volar aspect and radiating all the way up   to the left portion of her neck. It is worse with any form of movement. 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 She says are actually doing diagnostic procedures for carpal   tunnel syndrome and she felt that she had a positive test with this and that   is when the pain seems to have really started. She followed up with her   primary care physician and was placed on Neurontin for this and has been   taking it and is scheduled to have an EMG performed on the 14th of next month   however her pain has steadily increased over the past 2 weeks. She denies any   frank weakness with this headaches problems seeing talking or swallowing with   this denies any fevers or chills chest pain cough or shortness of breath. She   has not taken anything else for her discomfort as she says Tylenol does not   work for her and she had a gastric bypass preventing her from taking NSAIDs     REVIEW OF SYSTEMS See HPI for further details.  All other review of systems   otherwise negative.Marland Kitchen     PAST MEDICAL HISTORY No past medical history on file.     SURGICAL HISTORY Past Surgical History Procedure Laterality Date    Gastric   bypass   C-section     CURRENT MEDICATIONS Prior to Admission medications Medication Sig Start Date   End Date Taking? Authorizing Provider gabapentin (NEURONTIN) 300 MG CAPS Take   300 mg by mouth 3 (three) times daily. Yes Historical Med Etonogestrel-Ethinyl   Estradiol (NUVARING VA) Insert  vaginally.    Historical Med Multiple   Vitamins-Minerals (MULTIVITAMIN PO) Take  by mouth.    Historical Med   rizatriptan (MAXALT-MLT) 10 MG TBDP Take 10 mg by mouth once as needed.   Historical Med oxycodone-acetaminophen (PERCOCET) 5-325 MG TABS Take 1-2   tablets by mouth every 6 (six) hours as needed. 06/24/12 07/13/13  Riportella,   Bryson Ha, PA ondansetron (ZOFRAN) 4 MG TABS Take 1 tablet by mouth every 8   (eight) hours as needed. 06/24/12 07/13/13  Riportella, Bryson Ha, PA     ALLERGIES No Known Allergies     FAMILY HISTORY No family history on file.     SOCIAL HISTORY History     Social History   Marital Status: Married   Spouse Name: N/A   Number of   Children: N/A   Years of Education: N/A  Social History Main Topics   Smoking status: Never Smoker   Smokeless   tobacco: None   Alcohol Use: No   Drug Use: No   Sexual Activity: None     Other Topics Concern   None     Social History Narrative     PHYSICAL EXAM VITAL SIGNS: BP 129/65    Pulse 86    Temp(Src) 98.2  F (36.8    C) (Oral)    Resp 16    Ht 64" (162.6 cm)    Wt 162 lb (73.483 kg)    BMI   27.79 kg/m2      SpO2 100%    LMP 06/29/2013 Constitutional:  Well developed,   well nourished, no acute distress, non-toxic appearance Eyes:  PERRL,   conjunctiva normal HENT:  Normocephalic nontraumatic oropharynx clear neck   tender to palpation left trapezius no midline step-off deformities full range   of motion without meningismus lymphadenopathy or carotid bruits Respiratory:    No respiratory distress, normal breath sounds Cardiovascular:  Normal rate,   normal rhythm, no murmurs, no gallops, no rubs Musculoskeletal:  Left upper   extremity is nontender to  palpation exception being a positive Tinel sign to   her left wrist she has no bony deformity full range of motion of 5 out of 5   strength Integument:  Well hydrated, no rash Neurologic:  Cranial nerves II   through XII grossly intact radial median ulnar nerves grossly intact motor and   sensory         RADIOLOGY/PROCEDURES     XR CERVICAL SPINE AP LATERAL AND ODONTOID Final Calan Doren X-RAY  CERVICAL SPINE:     CLINICAL INDICATION: Neck pain L cervical radiculopathy     TECHNIQUE: 5 views of the cervical spine.     COMPARISON: None     FINDINGS: C7-T1 disc space is seen.     Vertebral body heights are maintained. Alignment is maintained.  Disc spaces   are maintained.     No fracture, dislocation, or other bone abnormality is identified.   Prevertebral soft tissues demonstrate no swelling.     IMPRESSION     Normal x-ray of the cervical spine.     Please note that this record was generated using voice recognition software.   If there are any questions about the content of this document, please contact   the radiologist at [phone] as some errors in transcription may have   occurred.             ED COURSE & MEDICAL DECISION MAKING Pertinent Labs & Imaging studies   reviewed. (See chart for details) Patient presents with what sounds to be   neuropathic pain to her left upper extremity difficult to determine if this is   indeed carpal tunnel syndrome are more of a cervical radiculopathy which I   favor given the fact she is having neck pain with it as well. X-ray of her   neck does not reveal any bony abnormality or spurring at this current point   and she subjectively is neurologically intact. She was given 2 Percocet here   with improvement of her pain will be given a small prescription for this along   with a Medrol Dosepak as an anti-inflammatory instructed to followup with EMG   as planned on the 14th. The patient was given the following medication in the   Emergency department:     Medication Administration  from 07/13/2013 1708 to 07/13/2013  1823      Date/Time Order Dose Route Action Action by Comments   07/13/2013 1740   oxycodone-acetaminophen (PERCOCET) 5-325 MG per tablet 2 tablet 2 tablet Oral   Given Caswell Corwin         FINAL DIAGNOSIS:     1. Radiculopathy, cervical         The patient was given the following medications to go home with.     New Prescriptions  METHYLPREDNISOLONE (MEDROL, PAK,) 4 MG KIT    Take as   directed  OXYCODONE-ACETAMINOPHEN (PERCOCET) 5-325 MG TABS    Take 1-2 tablets   by mouth every 6 (six) hours as needed.     Electronically signed by: Herby Abraham, MD         Herby Abraham, MD 07/13/13 1824         _________________________________  Signed by:  Bryon Lions    SH    D: 07/13/2013 06:24 PM  T: 07/13/2013 06:24 PM    This document is confidential medical information.  Unauthorized disclosure or   use of this information is prohibited by law.  If you are not the intended recipient of this document, please advise [place] by   calling immediately [phone].

## 2013-07-13 NOTE — Other (Unsigned)
CURRENT STATUS IS EMERGENCY .      :     Any questions regarding BILLING should be directed to the Care Management   Departments: [facility] [phone] or [facility]   [phone].         _________________________________  Signed byJamison Neighbor:    [facility]  NOTIFY    ZZ    D: 07/13/2013 05:08 PM  T:    This document is confidential medical information.  Unauthorized disclosure or   use of this information is prohibited by law.  If you are not the intended recipient of this document, please advise us by   calling immediately [phone].

## 2013-07-14 NOTE — Telephone Encounter (Signed)
Pt advised and appt made

## 2013-07-14 NOTE — Telephone Encounter (Signed)
Pt was seen in ER over the weekend  And the doctor there wrote a note for her to return to school on 4/3    She needs to return today but with no movement of left arm  Can Dr Louanna RawAbbott re write note to say this  Pt is faxing the original ER note to [facility]

## 2013-07-14 NOTE — Telephone Encounter (Signed)
If she does not move her left arm, she will end up with a frozen shoulder.  I will write it for limited movement. Please emphasize to her that if she avoids all movement, she will end up with more problems    Please have her schedule follow up with me in office several days after her EMG/nerve conduction studies that are scheduled on 4/14

## 2013-07-15 NOTE — Telephone Encounter (Signed)
Pt advised

## 2013-07-15 NOTE — Telephone Encounter (Signed)
Pt called said she wanted a phone consult with Dr Louanna RawAbbott regarding her arm  Advised I could make appt for pt to be seen, could not guarantee a phone consult  She made appt for 4/2 but if a phone consult can be done , please call pt

## 2013-07-15 NOTE — Telephone Encounter (Signed)
I would prefer to see her

## 2013-07-17 MED ORDER — GABAPENTIN 600 MG PO TABS
600 MG | ORAL_TABLET | ORAL | Status: DC
Start: 2013-07-17 — End: 2013-08-01

## 2013-07-17 NOTE — Progress Notes (Signed)
Rhonda Mcguire is a 38 y.o. female presenting today with c/o    Neck pain and left arm pain:  Started 10 days ago. Now 9/10 pain. Awakens at 3 am with pain.  Gabapentin was not helping enough. Went to ER 07/14/2013. Given oxycodone. That is what helps with her pain. Wants refill. Hx of refractory migraines for which she takes muscle relaxer nightly.   The pain is described as aching, burning and shooting. The onset of the pain was sudden, not related to any specific activity. The pain occurs every day and is continuous. Location is global, neck, trapezius, radiates down left  arm. . No history of dislocation. Symptoms are aggravated by extension, left rotation, left abduction of neck. Symptoms are diminished by narcotics. Limited activities include: most. Hurts to do anything. . No stiffness is reported. 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   Wants note for limited participation PT assistant clinicals.  Has EMG scheduled 07/29/2013.  Wants referral to pain management if examiner won't refill narcotics  Crying during visit  Denies depression  Review of Systems - comprehensive review of systems negative except as noted in HPI    Allergies   Allergen Reactions   ??? Amitriptyline Other (See Comments)     confusion   ??? Nsaids      Gastric bypass surgery       Past Medical History   Diagnosis Date   ??? Renal calculi    ??? Depression    ??? Gastric bypass status for obesity      rouen-Y 2011   ??? Migraine        Past Surgical History   Procedure Laterality Date   ??? Cesarean section     ??? Roux-en-y gastric bypass         Family History   Problem Relation Age of Onset   ??? Asthma Mother    ??? Cancer Father      stomach       History     Social History   ??? Marital Status: Married     Spouse Name: N/A     Number of Children: 1   ??? Years of Education: N/A     Occupational History   ??? student-physical therapy assistant      Social History Main Topics   ??? Smoking status: Never Smoker    ??? Smokeless tobacco: Never Used   ???  Alcohol Use: 0.0 oz/week   ??? Drug Use: No   ??? Sexual Activity: Not on file     Other Topics Concern   ??? Not on file     Social History Narrative         Current Outpatient Prescriptions on File Prior to Visit   Medication Sig Dispense Refill   ??? gabapentin (NEURONTIN) 300 MG capsule 1 cap po bid for 2 days, then tid  90 capsule  0   ??? topiramate (TOPAMAX) 100 MG tablet Take 0.5 tablets by mouth nightly.  90 tablet  3   ??? tiZANidine (ZANAFLEX) 4 MG tablet Take 1 tablet by mouth nightly.  90 tablet  2   ??? Magnesium 400 MG CAPS Take  by mouth.       ??? vitamin B-12 (CYANOCOBALAMIN) 1000 MCG tablet Take 1,000 mcg by mouth daily.       ??? rizatriptan (MAXALT) 5 MG tablet Take 5 mg by mouth once as needed for Migraine. May repeat in 2 hours if needed       ???  Riboflavin 400 MG CAPS 1 tab po daily  30 capsule  0   ??? Coenzyme Q10 100 MG CHEW 1 tab po tid  90 tablet  0   ??? Etonogestrel-Ethinyl Estradiol (NUVARING VA) Place  vaginally.       ??? Multiple Vitamin (MULTI-VITAMIN DAILY PO) Take  by mouth.         No current facility-administered medications on file prior to visit.       Filed Vitals:    07/17/13 0922   BP: 128/82   Pulse: 79         Wt Readings from Last 3 Encounters:   07/17/13 161 lb 12.8 oz (73.392 kg)   07/11/13 162 lb 6.4 oz (73.664 kg)   06/03/13 164 lb (74.39 kg)     BP Readings from Last 3 Encounters:   07/17/13 128/82   07/11/13 140/90   06/03/13 138/80         BP 128/82    Pulse 79    Ht 5\' 4"  (1.626 m)    Wt 161 lb 12.8 oz (73.392 kg)    BMI 27.76 kg/m2      Breastfeeding? No     General appearance: alert, appears stated age and cooperative  Eyes: negative findings: lids and lashes normal and conjunctivae and sclerae normal  Neck: no adenopathy and normal ROM. in neck and left trapezius with rotation to left, extension, abduction left. no radicular pain  Back: symmetric, no curvature. ROM normal. No CVA tenderness.  Extremities: extremities normal, atraumatic, no cyanosis or edema  Pulses: 2+ and  symmetric  Neurologic: Alert and oriented X 3, normal strength and tone. Normal symmetric reflexes. Normal coordination and gait  Skin: Skin is warm and dry. Marland Kitchen   Psychiatric: flat mood and affect. speech is normal and behavior is normal. Judgment, cognition and memory are normal.         Assessment and Plan  1. Radicular pain in left arm    2. Neck pain    Discussed with patient and husband treatment options including PT  Advised increase gabapentin for neuropathic pain as opposed to narcotics.    Given that patient has daily chronic HA"s for which she sees HA specialist and requires nightly muscle relaxer, would consider her high risk for chronic pain  MRI arranged for tomorrow, although exam normal. Discussed that I am ordering in case of referral to specialist/pain management  EMG scheduled 3/14 and f/u appt with me 3/17  Can call tomorrow early afternoon if not adequate relief from increased gabapentin. However, discussed that I would refill narcotic one time only

## 2013-07-17 NOTE — Patient Instructions (Addendum)
Keep your appointment as scheduled with me in 2 weeks  Increase your gabapentin to 600 mg 4 times daily  Call tomorrow early afternoon if not  Noticing improvement in pain

## 2013-07-18 MED ORDER — OXYCODONE-ACETAMINOPHEN 5-325 MG PO TABS
5-325 MG | ORAL_TABLET | Freq: Every day | ORAL | Status: DC
Start: 2013-07-18 — End: 2013-08-01

## 2013-07-18 NOTE — Telephone Encounter (Signed)
Please notify ready for pick up

## 2013-07-18 NOTE — Telephone Encounter (Signed)
Pt called said pt could call if she needed oxycodone and she is requesting it

## 2013-07-18 NOTE — Telephone Encounter (Signed)
[facility]

## 2013-07-21 ENCOUNTER — Telehealth

## 2013-07-21 NOTE — Telephone Encounter (Signed)
From: Tiburcio Peaarona Apsey  To: Su MonksSusan Brinkman Abbott, MD  Sent: 07/19/2013 6:25 AM EDT  Subject: Non-Urgent Medical Question    Dr. Louanna RawAbbott,  First I feel the need to apologize your staff on Friday because of the mixup on the medication script and thank them for stay a few minutes late so I could get that get that much needed medicine. I just wanted to remind you about my absorption issues because of my gastric bypass, I know I meant to bring that up again at my apt. and I forgot. 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 Sorry this was not a question. Thank you for your time, Tiburcio PeaCarona Schechter

## 2013-07-21 NOTE — Telephone Encounter (Signed)
Regarding: Non-Urgent Medical Question  Contact: [phone]  ----- Message from [name] sent at 07/21/2013  7:53 AM EDT -----       ----- Message from Rhonda Peaarona Dino to Su MonksSusan Brinkman Sergio Zawislak, MD sent at 07/19/2013  6:25 AM -----   Dr. Louanna RawAbbott,  First I feel the need to apologize your staff on Friday because of the mixup on the medication script and thank them for stay a few minutes late so I could get that get that much needed medicine. I just wanted to remind you about my absorption issues because of my gastric bypass, I know I meant to bring that up again at my apt. and I forgot. 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 Sorry this was not a question. Thank you for your time,  Rhonda PeaCarona Mcguire

## 2013-07-21 NOTE — Telephone Encounter (Signed)
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  I am referring her to a spinal surgeon, but hopefully she won't need surgery.  I want for her to still have the nerve conduction studies because he will want these done before he sees her.  Please see if she has scheduled her physical therapy with [facility] yet.  I want for her to proceed with this  Please see what works for her and schedule her an appointment with Dr. Jacqlyn LarsenSaul.  I have placed the referral. It will need processed-[facility]    Thanks

## 2013-07-22 NOTE — Telephone Encounter (Signed)
Pt said she got a message from Dr Louanna RawAbbott staff saying to schedule an appt with a Careers advisersurgeon.  I asked her if she was given name of one, she said she was told to call here.    2. having gabapentin issues, still in pain   She either needs to see Dr Louanna RawAbbott or have her refer to pain management  What do you want her to do?    3/  She wanted to advise she is having physical Therapy on Wed  4/8    Please call pt

## 2013-07-22 NOTE — Other (Unsigned)
[facility] Emergency Department ED PATIENT ENCOUNTER ARRIVAL:   07/22/13 1630 CSN: [id number]     HAR: [id number] EMERGENCY DEPARTMENT -    Extremity NOTE     CHIEF COMPLAINT Chief Complaint Patient presents with   Arm Pain     HPI Rhonda Mcguire is a 38 year old female who presents complaining of left   arm burning. She rates severity as 7/10, radiating from the left shoulder down   into the left wrist. No aggravating or relieving factors. She denies any   weakness. Patient was recently diagnosed with a herniated disc in her neck.   She is supposed to see a neurosurgeon, Dr. Wynetta Fines, on the 17th. She is currently   out of her medications. She is using a TENS unit with mild success. She   states the pain was under control with her previous medications which included   Percocet and a steroid Dosepak.     REVIEW OF SYSTEMS All other review of systems are negative except what   mentioned in the HPI.     PAST MEDICAL HISTORY Past Medical History Diagnosis Date   Unspecified   essential hypertension     SURGICAL HISTORY Past Surgical History Procedure Laterality Date   Gastric   bypass   C-section     CURRENT MEDICATIONS Prior to Admission medications Medication Sig Start Date   End Date Taking? Authorizing Provider tizanidine (ZANAFLEX) 4 MG CAPS Take  by   mouth.   Yes Historical Med oxycodone-acetaminophen (PERCOCET) 5-325 MG TABS   Take 1-2 tablets by mouth every 6 (six) hours as needed. 07/22/13  Yes Adamow,   Rebeca Allegra, MD methylPREDNISolone 4 MG KIT Use as directed 07/22/13  Yes   Adamow, Rebeca Allegra, MD gabapentin (NEURONTIN) 300 MG CAPS Take 300 mg by   mouth 3 (three) times daily. Yes Historical Med oxycodone-acetaminophen   (PERCOCET) 5-325 MG TABS Take 1-2 tablets by mouth every 6 (six) hours as   needed. 07/13/13  Yes Herby Abraham, MD methylPREDNISolone (MEDROL,   PAK,) 4 MG KIT Take as directed 07/13/13  Yes Herby Abraham, MD   Etonogestrel-Ethinyl Estradiol ([place]) Insert  vaginally.   Yes   Historical Med Multiple Vitamins-Minerals (MULTIVITAMIN PO) Take  by mouth.     Yes Historical Med rizatriptan (MAXALT-MLT) 10 MG TBDP Take 10 mg by mouth   once as needed.   Yes Historical Med     ALLERGIES No Known Allergies     FAMILY HISTORY No family history on file.     SOCIAL HISTORY History     Social History   Marital Status: Married   Spouse Name: N/A   Number of   Children: N/A   Years of Education: N/A     Social History Main Topics   Smoking status: Never Smoker   Smokeless   tobacco: None   Alcohol Use: Yes   Drug Use: No   Sexual Activity: None     Other Topics Concern   None     Social History Narrative     PHYSICAL EXAM VITAL SIGNS: BP 175/90    Pulse 100    Temp(Src) 98.6  F (37    C)    Resp 16    Ht 64" (162.6 cm)    Wt 162 lb (73.483 kg)    BMI 27.79 kg/m2        SpO2 100%    LMP 06/29/2013 Constitutional:  Well developed, well  nourished, no acute distress, non-toxic appearance HENT:  Atraumatic, external   ears normal, nose normal, oropharynx moist.  Neck- normal range of motion, no   tenderness, supple Respiratory:  No respiratory distress, normal breath   sounds. Cardiovascular:  Normal rate, normal rhythm, no murmurs, no gallops,   no rubs GI:  Soft, nondistended, normal bowel sounds, nontender   Musculoskeletal: Full range of motion of the left shoulder, left elbow left   wrist and left hand. Radial pulse 2+. Integument:  Well hydrated, no rash   Neurologic: Distal sensation intact to all digits of the left upper extremity,   5/5 strength including grip, wrist flexion extension, elbow flexion   extension.     ED COURSE & MEDICAL DECISION MAKING     Pertinent Labs & Imaging studies reviewed. (See chart for details)     Review of the record shows the patient has a bulging disc at C3-C4 which is   minimally impinging on the cord. She denies any weakness. She essentially   needs more pain medications to help control her symptoms until she sees her   neurosurgeon  on the 17th. She will be treated here with Dilaudid and sent home   with Percocet.     The patient was given the following medication in the Emergency department:     Medication Administration from 07/22/2013 1630 to 07/22/2013 1913  None         Patient was given the signs and symptoms of worsening condition and told to   return if they occurred.  Patient understands the plan and management and all   questions were answered.  Patient was given suspected diagnosis and other   possible etiology.  Patient was given discharge instructions.  Patient will   follow up with her primary care physician in the next 2-3 days.  Patient was   instructed on Culture results and preliminary radiology reads and that she   would be contacted if a variance or positive test is obtained.  The nursing   note was reviewed, agreed and appreciated.     Final Diagnosis:     1. Radiculopathy, cervical         The patient was given the following medications to go home with.     New Prescriptions  METHYLPREDNISOLONE 4 MG KIT    Use as directed    OXYCODONE-ACETAMINOPHEN (PERCOCET) 5-325 MG TABS    Take 1-2 tablets by mouth   every 6 (six) hours as needed.     Electronically signed by: Jolinda Croak, MD         Jolinda Croak, MD 07/22/13 1915         _________________________________  Signed by:  Jeryl Columbia ADAMOW    10    D: 07/22/2013 07:15 PM  T: 07/22/2013 07:15 PM    This document is confidential medical information.  Unauthorized disclosure or   use of this information is prohibited by law.  If you are not the intended recipient of this document, please advise [place] by   calling immediately [phone].

## 2013-07-22 NOTE — Other (Unsigned)
CURRENT STATUS IS EMERGENCY .      :     Any questions regarding BILLING should be directed to the Care Management   Departments: [facility] [phone] or [facility]   [phone].         _________________________________  Signed byJamison Neighbor:    [facility]  NOTIFY    ZZ    D: 07/22/2013 04:30 PM  T:    This document is confidential medical information.  Unauthorized disclosure or   use of this information is prohibited by law.  If you are not the intended recipient of this document, please advise us by   calling immediately [phone].

## 2013-07-22 NOTE — Telephone Encounter (Signed)
Referral faxed for dr Jacqlyn Larsensaul. appt made also. Pt informed

## 2013-07-24 NOTE — Telephone Encounter (Signed)
From: Rhonda Mcguire  To: Su MonksSusan Brinkman Abbott, MD  Sent: 07/23/2013 11:22 PM EDT  Subject: Non-Urgent Medical Question    Just wanted to let you know that on Monday night I visited an urgent care clinic in pain and was advised to take either more of what I was prescribed or go to the emergency room. I am having adverse reaction to the increased dose of gabapentin and I tried to convey that in my message the other day. The gabapentin makes very shaky and I have random muscle twitches almost like some who shows signs of Parkinson's disease. Also, my pain became so intense on Tuesday that I ended up at the emergency room. I would like to cutback on my gabapentin but am wondering who it will affect my pain. Thank for your time, Rhonda Mcguire

## 2013-07-25 ENCOUNTER — Telehealth

## 2013-07-25 NOTE — Telephone Encounter (Signed)
From: Tiburcio Peaarona Sanluis  To: Su MonksSusan Brinkman Abbott, MD  Sent: 07/24/2013 5:25 PM EDT  Subject: Non-Urgent Medical Question    I contacted the pain management clinic you suggested, the soonest and the first available appointment was April 21 and that was just for paperwork and consultation. They did not have my information so I assumed you had not put in a referral yet. I also hoped by that point in time either physical therapy would have helped resolve my problem or that the spinal surgeon would have decided how to proceed. I hear your concerns but I can't help the pain that I feel everyday and the side effects I'm having. Since my gastric bypass surgery my body does not seem to respond/absorb medicine like it should. I went to physical therapy yesterday and they put me on spinal traction and they had to stop the procedure because the pain starting shooting down my left and further down my spine. Today, I now have a burning sensation that is not only going down my left arm but now it is also burning down the center of my spine and occasionally I am also getting zingers down my right side. So I am in pain and wish the gabapentin was as affective as you would like it to be.

## 2013-07-25 NOTE — Telephone Encounter (Signed)
Spoke with pt she said that she didn't take that appt. Advised her that she needs to call them back and take the 4/21 appt. Referral [facility].

## 2013-07-25 NOTE — Telephone Encounter (Signed)
Please initiate [facility] referral pain management that I printed and make sure she actually took the 4/21 appointment with pain management please

## 2013-07-25 NOTE — Telephone Encounter (Signed)
Regarding: Non-Urgent Medical Question  Contact: [phone]  ----- Message from Shellee MiloKathryn Whitt, MA sent at 07/25/2013  8:02 AM EDT -----       ----- Message from Rhonda Peaarona Mcneill to Su MonksSusan Brinkman Alaja Goldinger, MD sent at 07/24/2013  5:25 PM -----   I contacted the pain management clinic you suggested, the soonest and the first available appointment was April 21 and that was just for paperwork and consultation. They did not have my information so I assumed you had not put in a referral yet. I also hoped by that point in time either physical therapy would have helped resolve my problem or that the spinal surgeon would have decided how to proceed.  I hear your concerns but I can't help the pain that I feel everyday and the side effects  I'm having. Since my gastric bypass surgery my body does not seem to respond/absorb medicine like it should. I went to physical therapy yesterday and they put me on spinal traction and they had to stop the procedure because the pain starting shooting down my left and further down my spine. Today, I now have a burning sensation that is not only going down my left arm but now it is also burning down the center of my spine and occasionally I am also getting zingers down my right side. So I am in pain and wish the gabapentin was as affective as you would like it to be.

## 2013-07-29 NOTE — Procedures (Signed)
[facility] [address]  [place], [place] [id number]  [phone]     Electrodiagnostic Medicine  Rehabilitation Services Department      Exam Date: 07/29/2013  Name: Rhonda Mcguire, Rhonda Exam Time: 12:25 PM  MR#: [id number] Patient ID: [id number]  Gender:  Date of Birth: September 28, 1975    Age: [age]    Referring Physician: Bridget HartshornS. Brinkman Abbott M.D.       Clinical Problem:   Patient is a 38 yo female with prior gastric bypass and migraines who complains of burning pain and paresthesias bilateral hands, worse past several mos.  She has occasional neck pain.  PE reveals fair strength throughout.  Reflexes are present and symmetric.     Summary:   1. NCS are within normal limits.  2. Needle EMG reveals polyphasic voluntary motor unit potentials with slight decreased recruitment within left C7 innervated muscles.  No muscle membrane irritability (sustained muscle denervation) is noted however.     Impression:   1. Probable mild left C7 radiculopathy.  Subacute reinnervation appears to be present in left C7 innervated muscles.  2. No evidence of a diffuse peripheral neuropathy, carpal tunnel syndrome or other focal mononeuropathy involving the upper extremities.    (Note:  If this report is being viewed under CHART REVIEW/PROCEDURES, table format can be better viewed using CHART REVIEW/NOTES/Note type: procedure)    Thank you.             Pansy Ostrovsky B. Hal Hopeichter, M.D.        Motor Nerve Conduction:            Nerve and Site   Lat  ms Amp  mV Segment   Dist  mm Lat Diff  ms CV   m/s   Median.R to Abductor pollicis brevis.R      Wrist 3.6 5.1 Abductor pollicis brevis-Wrist 80 3.6    Elbow 7.8 5.1 Wrist-Elbow 230 4.2 55   Ulnar.R to Abductor digiti minimi (manus).R      Wrist 3.0 9.7 Abductor digiti minimi (manus)-Wrist 80 3.0    Above elbow 7.3 9.4 Wrist-Above elbow 250 4.3 58   Median.L to Abductor pollicis brevis.L      Wrist 3.5 5.6 Abductor pollicis brevis-Wrist 80 3.5    Elbow 7.6 5.4 Wrist-Elbow 220 4.1 54   Ulnar.L to Abductor  digiti minimi (manus).L      Wrist 3.1 8.4 Abductor digiti minimi (manus)-Wrist 80 3.1    Above elbow 7.6 8.7 Wrist-Above elbow 240 4.5 53     Nerve Lat ? ms Amp ? mV CV ? m/s  Median 4.2 4 50  Ulnar 4.2 4 50  Peroneal 5.5 2 40  Tibial 6.1 4 40    F-waves:        Nerve   M-Lat  ms F-Lat  ms        Median.L  27.6      Sensory and Mixed Nerve Conduction:             Nerve and Site   Peak  Lat ms Amp  mV Segment   Lat Diff  ms Dist  mm   Median.R to Digit II (index finger).R      Wrist 3.4 29 Digit II (index finger)-Wrist  140   Ulnar.R to Digit V (little finger).R      Wrist 3.1 35 Digit V (little finger)-Wrist  140   Median.L to Digit II (index finger).L      Wrist 3.4 32 Digit II (index finger)-Wrist  140   Ulnar.L to Digit V (little finger).L      Wrist 3.1 27 Digit V (little finger)-Wrist  140     Nerve Peak Lat ? ms Amp ? ??V   Median 3.6 20  Ulnar 3.7 20  Radial 2.8 10  Sural 4.0 5  Peroneal 3.5 5    Needle EMG Examination:      Muscle Insertion Spontaneous Activity Volutional MUAPs Comments    Activity Fibs PSW Fasc Other Effort Recruit Dur Amp Poly     Biceps brachii.L Normal 0 0 None  Normal Normal Normal Normal None    Triceps brachii.L Normal 0 0 None  Normal Reduced Sl Incr Normal Few    Pronator teres.L Normal 0 0 None  Normal Normal Normal Normal None    Extensor indicis proprius.L Normal 0 0 None  Normal Reduced Sl Incr Normal Few    Abductor pollicis brevis.L Normal 0 0 None  Normal Normal Normal Normal None    1st dorsal interosseous.L Normal 0 0 None  Normal Normal Normal Normal None    Flexor carpi radialis.L Normal 0 0 None  Normal Reduced Sl Incr Normal Few    Cervical paraspinals: mid.L Normal 0 0 None          Biceps brachii.R Normal 0 0 None  Normal Normal Normal Normal None    Triceps brachii.R Normal 0 0 None  Normal Normal Normal Normal None    Pronator teres.R Normal 0 0 None  Normal Normal Normal Normal None    Extensor indicis proprius.R Normal 0 0 None  Normal Normal Normal Normal None     Abductor pollicis brevis.R Normal 0 0 None  Normal Normal Normal Normal None    1st dorsal interosseous.R Normal 0 0 None  Normal Normal Normal Normal None    Cervical paraspinals: .R Normal 0 0 None

## 2013-07-31 NOTE — Other (Unsigned)
CURRENT STATUS IS EMERGENCY .      :     Any questions regarding BILLING should be directed to the Care Management   Departments: [facility] [phone] or [facility]   [phone].         _________________________________  Signed byJamison Neighbor:    [facility]  NOTIFY    ZZ    D: 07/31/2013 07:07 PM  T:    This document is confidential medical information.  Unauthorized disclosure or   use of this information is prohibited by law.  If you are not the intended recipient of this document, please advise us by   calling immediately [phone].

## 2013-07-31 NOTE — Other (Unsigned)
------------------------------------------------------------------------------  -- Attestation signed by Barton Dubois., MD at 07/31/13 2221 EMERGENCY   DEPARTMENT - ATTENDING NOTE         I have personally seen and examined this patient. I have fully   participated in the care of this patient with the resident/MLP.  I have   reviewed and agree with all pertinent clinical information including history,   physical exam, labs, radiographic studies and the plan. I have also reviewed   and agree with the medications, allergies and past medical history sections   for this patient.     Electronically signed by: Newman Nip. Milinda Pointer, MD   ------------------------------------------------------------------------------  --     [facility] ED PATIENT ENCOUNTER ARRIVAL:   07/31/13 1907 CSN: [id number]     HAR: [id number]     EMERGENCY DEPARTMENT ENCOUNTER     CHIEF COMPLAINT: Chief Complaint Patient presents with   Neck Pain     HPI: Rhonda Mcguire is a 38 year old female who presents complaining of neck   pain. The neck pain has been going on for approaching a month.  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   Patient has a documented cervical herniated disc and is supposed to see Dr.   Rolanda Jay tomorrow. She's had previous emergency department visits because   of the severity of the pain and discomfort and has been referred on to   professional pain management by her primary care provider. Patient stated that   appointment with Dr. Wynetta Fines is tomorrow but pain management is not until the   end of the month. Movement makes the pain worse, and nothing seems to make it   better.  The patient rates the pain at a 7 out of 10.  The pain is described   as a lateral paracervical musculature with primarily left cervical   radiculopathy. Patient reports her recent dosing of prednisone seemed to help   the symptoms some in the Percocet has now run out.  There has been no loss of   control of bladder or bowels. Patient denies any other injuries, fever, chest   pain, dyspnea, vomiting, difficulty ambulating, or any other concerns.     REVIEW OF SYSTEMS: As in the HPI.  All other systems are reviewed and are   negative.     PAST MEDICAL HISTORY: Past Medical History Diagnosis Date   Unspecified   essential hypertension   Herniated cervical disc     SURGICAL HISTORY: Past Surgical History Procedure Laterality Date   Gastric   bypass   C-section     CURRENT MEDICATIONS: Prior to Admission medications Medication Sig Start Date   End Date Taking? Authorizing Provider topiramate (TOPAMAX) 25 MG TABS Take 50   mg by mouth nightly.   Yes Historical Med co-enzyme Q-10 50 MG CAPS Take 100   mg by mouth daily.   Yes Historical Med Cyanocobalamin (B-12 DOTS SL) by   Sublingual route daily.   Yes Historical Med tizanidine (ZANAFLEX) 4 MG CAPS   Take  by mouth.   Yes Historical Med gabapentin (NEURONTIN) 300 MG CAPS Take   600 mg by mouth 4 (four) times daily. Yes Historical Med   oxycodone-acetaminophen (PERCOCET) 5-325 MG TABS Take 1-2 tablets by mouth   every 6 (six) hours as needed. 07/13/13  Yes Herby Abraham, MD   Etonogestrel-Ethinyl Estradiol ([place]) Insert  vaginally.   Yes  Historical Med Multiple Vitamins-Minerals (MULTIVITAMIN PO) Take  by mouth.     Yes Historical Med rizatriptan (MAXALT-MLT) 10 MG TBDP Take 10 mg by mouth   once as needed.   Yes Historical Med oxycodone-acetaminophen (PERCOCET) 5-325   MG TABS Take 1-2 tablets by mouth every 6 (six) hours as needed. 07/22/13   07/31/13  Jolinda Croak, MD methylPREDNISolone 4 MG KIT Use as   directed 07/22/13 07/31/13  Adamow, Rebeca Allegra, MD methylPREDNISolone   (MEDROL, PAK,) 4 MG KIT Take as directed 07/13/13 07/31/13 Herby Abraham, MD     ALLERGIES: Allergies Allergen Reactions   Nsaids Other (See Comments)   DUE   TO GASTRIC BYPASS SURGERY     FAMILY HISTORY: No family history  on file. Noncontributory     SOCIAL HISTORY: History     Social History   Marital Status: Married   Spouse Name: N/A   Number of   Children: N/A   Years of Education: N/A     Social History Main Topics   Smoking status: Never Smoker   Smokeless   tobacco: None   Alcohol Use: Yes   Drug Use: No   Sexual Activity: None     Other Topics Concern   None     Social History Narrative     PHYSICAL EXAMINATION: VITAL SIGNS: BP 184/90    Pulse 98    Temp(Src) 99.4  F   (37.4  C) (Oral)    Resp 16    Ht 64" (162.6 cm)    Wt 162 lb (73.483 kg)      BMI 27.79 kg/m2      SpO2 100%    LMP 06/29/2013 Constitutional:   Well-developed, well-nourished, in no acute distress. HEENT: Atraumatic,   normocephalic, PERRLA; non-icteric and non-injected. Normal hearing. Normal   appearing nose. Moist mucous membranes. Lymphatic: No lymphangitis and/or   lymphedema. Respiratory: No respiratory distress. Lungs clear to auscultation   bilaterally. No wheezes, rhonchi or rhales. Cardiovascular: RRR, no murmurs,   gallops or rubs. GI: Abdomen soft, NT/ND. No rigidity, rebound, or guarding.   Normal bowel sounds. No CVA tenderness. Musculoskeletal: Patient has   tenderness in the left and right paracervical region, with increasing pain   with ROM of the neck. Able to move all extremities. No joint tenderness or   effusions. Patient demonstrates 5 out of 5 strength to shoulder abduction,   elbow flexion/extension, wrist flexion/extension, finger flexion/extension,   grip strength. Neurovascular integrity as documented intact. Skin: No rashes,   lesions, or lacerations. Neurologic: Alert and oriented x 3. No gross motor or   sensory deficits noted. Psychological: Tearful and anxious with no evidence   of depressed mood and affect and an excellent historian.     ED COURSE & MEDICAL DECISION MAKING: Pertinent laboratory and diagnostic   studies performed in the ED have been reviewed. (See EMR chart for full   details).     The patient was given the  following medication in the Emergency department:     Medication Administration from 07/31/2013 1907 to 07/31/2013 2007      Date/Time Order Dose Route Action Action by Comments   07/31/2013 1956   orphenadrine (NORFLEX) injection 60 mg 60 mg Intramuscular Given Sherri    Deaton per pt request, give on same side.   07/31/2013 1954 promethazine   (PHENERGAN) injection 25 mg 25 mg Intramuscular Given Sherri  Deaton given   with dilaudid   07/31/2013 1954  HYDROmorphone (DILAUDID) injection 1 mg 1 mg   Intramuscular Given Sherri  Deaton         Rhonda Mcguire is a 38 year old female who presents the the ED with neck   pain. We reviewed the patient's recent medical records. We're aware of her   referral onto [facility] as well as pain management. We have explained to her that   we would consider ongoing care and management of her neck pain here in the   emergency department today but that the emergency department was not the   location for ongoing care and management of what is developing into a cervical   condition which appears chronic. With that being said, she was medicated with   IM injections as above and will be discharged with a short course of   medications to get her through until she can be seen and evaluated by both   neurosurgery and pain management. I am not comfortable placing her back on a   prednisone dose pack today as she recently had one at the beginning of the   week which was marginal in its efficacy. There is no swelling, voice change,   stridor, carotid bruits, or neurologic deficits to suggest need for further   imaging or further emergency evaluation at this point.     The patient was given the following prescriptions to go home with.     New Prescriptions  METHOCARBAMOL (ROBAXIN) 750 MG TABS    Take 1 tablet by   mouth every 6 (six) hours as needed.  OXYCODONE-ACETAMINOPHEN (PERCOCET) 5-325   MG TABS    Take 1 tablet by mouth every 6 (six) hours as needed.     Disposition: Discharge to home      Final diagnosis: 1. Left cervical radiculopathy 2. Neck pain     Barton Dubois., MD provided face to face time with the patient today in   the emergency department and agrees with the above mentioned diagnosis,   prognosis and proposed treatment plan.     Please note that some or all of this record was generated using voice   recognition software. If there are any questions about the content of this   document, please contact the author as some errors in transcription may have   occurred.     Electronically signed by: Adele Barthel. Royetta Asal, PA         Fredderick Erb., PA 07/31/13 2007     Barton Dubois., MD 07/31/13 2221         _________________________________  Signed by:  Reeves Dam    D: 07/31/2013 10:21 PM  T: 07/31/2013 10:21 PM    This document is confidential medical information.  Unauthorized disclosure or   use of this information is prohibited by law.  If you are not the intended recipient of this document, please advise [place] by   calling immediately [phone].

## 2013-08-01 MED ORDER — TIZANIDINE HCL 4 MG PO TABS
4 MG | ORAL_TABLET | Freq: Three times a day (TID) | ORAL | Status: AC
Start: 2013-08-01 — End: ?

## 2013-08-01 MED ORDER — GABAPENTIN 600 MG PO TABS
600 MG | ORAL_TABLET | ORAL | Status: AC
Start: 2013-08-01 — End: ?

## 2013-08-01 NOTE — Progress Notes (Signed)
Rhonda Mcguire is a 38 y.o. female presenting today with c/o    Neck pain and left arm pain:  Started 20 days ago. Now 9/10 pain. Awakens at 3 am with pain. Gabapentin was not helping enough. Went to ER 07/14/2013. Given oxycodone.  07/31/2013 given oxycodone. That is what helps with her pain. Wants refill. Hx of refractory migraines for which she takes muscle relaxer nightly.  The pain is described as aching, burning and shooting. The onset of the pain was sudden, not related to any specific activity. The pain occurs every day and is continuous. Location is global, neck, trapezius, radiates down left arm. No some radiation to scalp and face.  Symptoms are aggravated by extension, left rotation, left abduction of neck. Symptoms are diminished by narcotics. Limited activities include: most. Hurts to do anything. . No stiffness is reported. 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   Went to 1 PT session. Has to use wright patt. Feels it's too far. Busy with school  Has appointment pain management May 1st.   Denies depression  Review of Systems - comprehensive review of systems negative except as noted in HPI    Allergies   Allergen Reactions   ??? Amitriptyline Other (See Comments)     confusion   ??? Nsaids      Gastric bypass surgery       Past Medical History   Diagnosis Date   ??? Renal calculi    ??? Depression    ??? Gastric bypass status for obesity      rouen-Y 2011   ??? Migraine    ??? Cervical disc herniation-C4-C5 MRI 07/2013 07/21/2013       Past Surgical History   Procedure Laterality Date   ??? Cesarean section     ??? Roux-en-y gastric bypass         Family History   Problem Relation Age of Onset   ??? Asthma Mother    ??? Cancer Father      stomach       History     Social History   ??? Marital Status: Married     Spouse Name: N/A     Number of Children: 1   ??? Years of Education: N/A     Occupational History   ??? student-physical therapy assistant      Social History Main Topics   ??? Smoking status: Never Smoker    ???  Smokeless tobacco: Never Used   ??? Alcohol Use: 0.0 oz/week   ??? Drug Use: No   ??? Sexual Activity: Not on file     Other Topics Concern   ??? Not on file     Social History Narrative         Current Outpatient Prescriptions on File Prior to Visit   Medication Sig Dispense Refill   ??? oxyCODONE-acetaminophen (PERCOCET) 5-325 MG per tablet Take 1 tablet by mouth daily. As needed for pain  15 tablet  0   ??? gabapentin (NEURONTIN) 600 MG tablet 1 tab po qid  120 tablet  0   ??? topiramate (TOPAMAX) 100 MG tablet Take 0.5 tablets by mouth nightly.  90 tablet  3   ??? tiZANidine (ZANAFLEX) 4 MG tablet Take 1 tablet by mouth nightly.  90 tablet  2   ??? Magnesium 400 MG CAPS Take  by mouth.       ??? vitamin B-12 (CYANOCOBALAMIN) 1000 MCG tablet Take 1,000 mcg by mouth daily.       ???  rizatriptan (MAXALT) 5 MG tablet Take 5 mg by mouth once as needed for Migraine. May repeat in 2 hours if needed       ??? Riboflavin 400 MG CAPS 1 tab po daily  30 capsule  0   ??? Coenzyme Q10 100 MG CHEW 1 tab po tid  90 tablet  0   ??? Etonogestrel-Ethinyl Estradiol ([place]) Place  vaginally.       ??? Multiple Vitamin (MULTI-VITAMIN DAILY PO) Take  by mouth.         No current facility-administered medications on file prior to visit.       Filed Vitals:    08/01/13 1518   Pulse: 105         Wt Readings from Last 3 Encounters:   08/01/13 158 lb (71.668 kg)   08/01/13 161 lb (73.029 kg)   07/17/13 161 lb 12.8 oz (73.392 kg)     BP Readings from Last 3 Encounters:   07/17/13 128/82   07/11/13 140/90   06/03/13 138/80         Pulse 105    Ht 5\' 4"  (1.626 m)    Wt 158 lb (71.668 kg)    BMI 27.11 kg/m2      SpO2 100%   General appearance: alert, appears stated age and cooperative  Neck: no adenopathy and thyroid not enlarged, symmetric, no tenderness/mass/nodules tenderness to palpation trapezius, neck paravertebral muscles  Back: symmetric, no curvature. ROM normal. No CVA tenderness.  Neurologic: Alert and oriented X 3, normal strength and tone. Normal  symmetric reflexes. Normal coordination and gait  Skin: Skin is warm and dry. Marland Kitchen.   Psychiatric: flat mood and affect. speech is normal and behavior is normal. Judgment, cognition and memory are normal.         Assessment and Plan  1. Cervical disc herniation-C4-C5 MRI 07/2013  Advised PT again  Continue gabapentin  Can increase zanaflax to tid for next 2 weeks only. Has required from [facility] specialist nightly chronically  Advised narcotics not treatment of choice  Advised pain management may choose not to prescribe these-app 5/1 pain management

## 2013-08-01 NOTE — Progress Notes (Signed)
History of present illness:   Ms. Rhonda Mcguire  is a 38 y.o. female , referred by Dr.Abbott for consultation regarding her neck pain and primarily left arm pain.    This patient states that her major complaint is severe pain in the posterior cervical muscles greater on the left side than the right along with left arm pain.    The patient is somewhat of a challenging historian.  She tends to relate her symptoms based on what other people have observed as opposed to what she is truly experiencing.  She is a physical therapy Location managertechnician student and relates a lot of her symptoms as being what her fellow students have told her is going on with her.  This makes it a little bit difficult to sort out the symptoms.    As best as we can get from her, approximately 4 weeks ago she began to experience a tightness in her posterior cervical muscles.  Subsequent to that she developed various types of pain and burning in the back of the neck but also in her left arm.  At one time she states it goes from her neck down her arm as one would expect with nerve root compression at other times she describes it going up from her fingers to her shoulder.    She sought medical help from her primary care physician who appropriately ordered an MRI scan.  She also ordered physical therapy.  The patient had 1 physical therapy session but then was told by her insurance carrier [employer]) that she must receive all of her treatment at [facility] [place].  Her husband is active Hotel managermilitary and they have the [employer] insurance.  Therefore she's had no further treatments.    She was referred here for evaluation.        The patient is referred for evaluation, opinions and recommendations.  Past medical history:  Her past medical history has been has been personally reviewed and reveals the following:  Past Medical History   Diagnosis Date   ??? Renal calculi    ??? Depression    ??? Gastric bypass status for obesity      rouen-Y 2011   ???  Migraine    ??? Cervical disc herniation-C4-C5 MRI 07/2013 07/21/2013        Past surgical history:  Her past surgical history has been personally reviewed and reveals the following:  Past Surgical History   Procedure Laterality Date   ??? Cesarean section     ??? Roux-en-y gastric bypass         Her medications and allergies were reviewed and reveal:  Current Outpatient Prescriptions   Medication Sig Dispense Refill   ??? oxyCODONE-acetaminophen (PERCOCET) 5-325 MG per tablet Take 1 tablet by mouth daily. As needed for pain  15 tablet  0   ??? gabapentin (NEURONTIN) 600 MG tablet 1 tab po qid  120 tablet  0   ??? topiramate (TOPAMAX) 100 MG tablet Take 0.5 tablets by mouth nightly.  90 tablet  3   ??? tiZANidine (ZANAFLEX) 4 MG tablet Take 1 tablet by mouth nightly.  90 tablet  2   ??? Magnesium 400 MG CAPS Take  by mouth.       ??? vitamin B-12 (CYANOCOBALAMIN) 1000 MCG tablet Take 1,000 mcg by mouth daily.       ??? rizatriptan (MAXALT) 5 MG tablet Take 5 mg by mouth once as needed for Migraine. May repeat in 2 hours if needed       ???  Riboflavin 400 MG CAPS 1 tab po daily  30 capsule  0   ??? Coenzyme Q10 100 MG CHEW 1 tab po tid  90 tablet  0   ??? Etonogestrel-Ethinyl Estradiol (NUVARING VA) Place  vaginally.       ??? Multiple Vitamin (MULTI-VITAMIN DAILY PO) Take  by mouth.         No current facility-administered medications for this visit.     Allergies   Allergen Reactions   ??? Amitriptyline Other (See Comments)     confusion   ??? Nsaids      Gastric bypass surgery       Social history:  Her social history has been reviewed and reveals the following:  History     Social History   ??? Marital Status: Married     Spouse Name: N/A     Number of Children: 1   ??? Years of Education: N/A     Occupational History   ??? student-physical therapy assistant      Social History Main Topics   ??? Smoking status: Never Smoker    ??? Smokeless tobacco: Never Used   ??? Alcohol Use: 0.0 oz/week   ??? Drug Use: No   ??? Sexual Activity: Not on file     Other Topics  Concern   ??? Not on file     Social History Narrative       Review of symptoms:  Patient's review of symptoms was personally reviewed by myself.      Physical examination:  Filed Vitals:    08/01/13 1257   Height: 5\' 4"  (1.626 m)   Weight: 161 lb (73.029 kg)     Body mass index is 27.62 kg/(m^2).      ??? The patient's appearance was that of a well-developed, well-nourished in no acute distress.    ??? Mental status during this examination was normal with respect to mood and affect, showing no signs of depression and/or anxiety. The patient was oriented as to person, place, time, and situation.      ??? Palpation and percussion of the spine from the cranium to the sacrum were normal, showing no deformity and/or pain.     ??? Stance, station, and gait were normal.  The patient could heel walk, toe walk, and tandem walk without any difficulty.  Coordination and balance were normal as tested by this gait examination.    ??? Range of motion of the head and neck was normal.  Spurling's on the right was negative; Spurling's on the left was negative.  Range of motion of both arms were normal.      ??? Stability of the joints of the neck and both arms was normal in movement, showing no signs of subluxation, dislocation, and/or pain.    ??? Deep tendon reflexes of both upper extremities were normal, being 2+ bilaterally in both arms.      ??? Motor examination of both upper extremities demonstrated 5/5 muscle strength in all muscle groups of both arms.      ??? Muscle tone of the musculature of the head and neck, spinal column, both arms, and both legs was normal, showing no clonus and/or spasticity.  Hoffmann's and Babinski's were absent bilaterally.    ??? Sensory examination to pin prick and/or fine touch of both arms was normal in all cervical dermatomes bilaterally.    The above examination shows no abnormalities of motor, sensory or reflex functions referable to the cervical spine, spinal cord and/or nerve  roots.        Imaging:  I  personally reviewed the MRI scan of her cervical spine that was performed on 07/19/13.  This demonstrates a significant sized left paracentral disc herniation at C4 5.  This is compressing the anterior thecal sac and abutting the anterior spinal cord.  There is no foraminal narrowing however on either the right or left side.  The patient has a central disc protrusion at C5 6 with no neural compression.  At C6 7 she has more of a left paracentral protrusion but again the neural foramen is open.    At this office visit I ordered and obtained plain radiographs of her cervical spine.    Radiographic Reading:    At today's office visit, I ordered and obtained in the office cervicall radiographics.     Views obtained included: AP and laterals: neutral, flexion and extensions.    AP views demonstrated:     ?? Alignment- good alignment of the cervical vertebral column.    ?? Facet joints showed no significant arthropathy    Lateral views demonstrated:  ?? Vertebral bodies were of good height and configuration. Alignment was good without any abnormal subluxation.   ?? Disc spaces were normal  ?? Flexion and extension views showed no abnormal movement. No instability of the cervical spine.    There are no structural abnormalities demonstrated on these radiographs.    Assessment and Plan:  I explained to the patient that the findings on the MRI scan do not necessarily correlate and an obvious fashion with her symptoms.  However, the C4 5 disc herniation could indeed be causing a significant amount of left-sided neck pain.    I explained how discs can herniate and pinched nerves and cause symptoms.    I explained the pathophysiology in a way that the patient indicated an understanding of how the symptoms are being produced.    I told the patient that options for treatment include: to live with this and adjust activities according to the symptoms; physical therapy; epidural steroid injections; or, surgery.    I told the patient that I  certainly would not recommend any invasive treatment at the present time.  I believe that she should treat this nonsurgically with physical therapy.  I told her that it is incumbent upon her to sort out where she can go to for this type of treatment with respect her health coverage.    I explained to her that we would start the therapy with comfort measures 1st hot packs, ultrasound and electrical stimulation.  We will also use cervical traction and we will then transition to a good exercise program.    I will order this 2 days a week for 6 weeks and plan on seeing the patient back in 6 weeks.

## 2013-08-11 NOTE — Telephone Encounter (Signed)
Patient called wanting to get a copy of her xray on a CD.  Pls call pt

## 2013-08-11 NOTE — Telephone Encounter (Signed)
CD made  Will have to pick up  At [facility]    Pt aware

## 2013-08-18 NOTE — Telephone Encounter (Signed)
Last seen 02.17.15    Last office note states to RTO 6 months/08.2015.    Next appointment scheduled for 08.18.15.

## 2013-08-20 MED ORDER — TIZANIDINE HCL 4 MG PO TABS
4 MG | ORAL_TABLET | ORAL | Status: AC
Start: 2013-08-20 — End: ?

## 2014-05-19 ENCOUNTER — Encounter (HOSPITAL_BASED_OUTPATIENT_CLINIC_OR_DEPARTMENT_OTHER): Payer: Self-pay | Admitting: Pain Management

## 2014-05-26 ENCOUNTER — Encounter (HOSPITAL_BASED_OUTPATIENT_CLINIC_OR_DEPARTMENT_OTHER): Payer: Self-pay | Admitting: Pain Management

## 2014-05-26 ENCOUNTER — Ambulatory Visit: Attending: Pain Management | Admitting: Pain Management

## 2014-05-26 VITALS — BP 163/99 | HR 104 | Ht 64.0 in | Wt 159.4 lb

## 2014-05-26 DIAGNOSIS — R0683 Snoring: Secondary | ICD-10-CM | POA: Insufficient documentation

## 2014-05-26 DIAGNOSIS — M542 Cervicalgia: Secondary | ICD-10-CM | POA: Insufficient documentation

## 2014-05-26 DIAGNOSIS — G4709 Other insomnia: Secondary | ICD-10-CM | POA: Insufficient documentation

## 2014-05-26 NOTE — Patient Instructions (Addendum)
1. Counseling I would strongly support    2. Sit and be Fit on PBS    3. Easy Yoga For Easing Pain DVD, by Gigi GinPeggy Cappy    4. Please ask your husband if you snore loudly and frequently, you may need a sleep study    5. Please read a Pain Survival Guide    6. Look into opportunities for aquatic/pool therapy

## 2014-05-26 NOTE — Progress Notes (Signed)
Pain Tracker Scores  ORT Score     -           2/26  AUDIT-C Score -        0/12  PHQ-9 Score  -           9/27  SI Score          -           0/3  GAD-7:           -           6/21  PTSD Score   -           n/a /4  STOP Score   -           3/4    Pain intensity & Interference (lower is better)  Pain intensity              7/10     Pain Interference        8/10    Sleep (lower is better)  Sleep Initiation            8/10  Sleep Maintenance     8/10    ODI & Important Activity Difficulty (lower is better)  ODI                             30/100  Activity Difficulty         10/10    QOL & Satisfaction   Quality of Life                 8/10  Treatment Satisfaction   0/10    Days With Excess Meds (Last Month): none    Important Activity:  using my arm for long periods of time    Most Bothersome Side Effect: memory        Review of Systems   Constitutional: Negative.    HENT: Positive for ear pain.    Eyes: Negative.    Respiratory: Negative.    Cardiovascular: Negative.    Gastrointestinal: Negative.    Endocrine: Negative.    Genitourinary: Positive for flank pain.   Musculoskeletal: Negative.    Skin: Negative.    Allergic/Immunologic: Negative.    Neurological: Positive for numbness (in feet. pins & needles).   Hematological: Negative.    Psychiatric/Behavioral: Negative.

## 2014-05-27 NOTE — Progress Notes (Signed)
Donna Wright, Donna Wright          [id number]          05/26/2014      PAIN CLINIC NOTE       IDENTIFICATION     She is a pleasant 39 year old female, who was referred to [place] by her primary care provider at [facility], [place], [place], with her chronic ongoing cervicalgia.    CHIEF COMPLAINT/HISTORY OF PRESENT ILLNESS     Donna Wright reports to [place] that most of her pain is in her cervical spine area with radiation into upper extremities; however, this is a lesser problem for her than neck pain per se.  When I asked how would she divide pain intensity between neck and arms, she said that approximately 75% or so is in her neck area and 25 in her arms and the left upper extremity is much more than right.  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  Then eventually this required a cervical spine fusion while still in [place], and this was C4-C5 anterior fusion.  The patient tells [place] that only for a very short time did her pain improve.    She was briefly apparently shortly after that in physical therapy.  She also had at some point acupuncture after it became apparent that pain was not essentially subsiding.  Also prior to her surgery, she was on opioid medications, and then after surgery, she told us that approximately after 1 month or so, she came off opioids, but then was restarted again.  However, then her husband, who was in [employer], and whole family moved to [place].  So that essentially she now tells [place] that approximately since September, she has been on opioid medications, again.  Currently, she is on 5/325 mg of hydrocodone with acetaminophen up to 6 tablets a day, so the total daily dose of 30 mg.  She finds this helpful.  In addition to this, I  see in the faxed notes there has been a concern from her healthcare team that she sometimes goes to the ER where apparently Dilaudid has been given, and the patient finds this most helpful.  When I asked about this specific issue, she said that overall, of course, she would like very much not to be going to the ER, but since January 2016 when she had medial branch blocks of her cervical facet joints she said that her pain worsened and that is why she went to the ER on 2 occasions.    Otherwise, she also tells [place] that she would like not to be on opioid medications, but at this point, she says only opioids help her with functioning; otherwise, she said that she would not do even what she does currently such as some household related chores, and also she does some cooking, walking her two dogs.  She enjoys all of it, and again says that if it was not for opioids then at this point at least, she would not be able to do those things.    When I asked her whether she does any exercises, it appears that she has a couple stretching exercises, which she tries to do, and then walking her dogs  for approximately half an hour a day; however, this does not have a little bit more of systematic tradition, for example walking with her husband and her 67 year old son on weekends or in the evenings, although she said that she would be interested in looking into this.    Other medications than opioids, she was tried on amitriptyline, which gave her she thought suicidal ideation, and this was disconnected.  She also was on Cymbalta, which according to the patient interacted with some other medications, and she was admitted to the ER once for serotonin syndrome and so therefore that was disconnected also.  She continues to be on gabapentin, and in terms of gabapentin, she does think that it is somewhat helpful with tingling and numbness-type sensation or pain in her upper extremities, but she says that even for that type pain her opioids  have been more effective.    When I asked whether she has ever been in any pain education class or any pain related counseling essentially she responded negatively.  However, she was in counseling for a year and a half in 2009 and 2010 after she had a gastric bypass when she said that she became very depressed; however, not since then.  She is very open to this, and also Donna Wright tells [place] that she has been referred to counseling by her primary care team, and she expects to meet with them in the nearest week or two, and in general, she is looking forward to this.    Also, I inquired whether she ever has been in aquatic or pool therapy.  She said that she has not; however, she would be very interested saying she likes water, and she always noticed that this has been helpful for her whenever she had a chance to do some swimming or any water-based stretching.    From diagnostic tests standpoint from notes that were made available to [place], I see that she had a electrodiagnostic studies of EMG and nerve conduction study in October 2015 of her upper extremities, which essentially was normal and then from Care Everywhere we see that she had a cervical spine MRI done in 05/02/2014, and according to the report, there was evidence of the patient being status post surgery at C4-C5.  Her cervical alignment was anatomic.  Degenerative disk changes were identified at C5-C6 and C6-C7.  Evidenced as disk desiccation and reduction in disk volume.  No significant protrusions or herniations.  No central or foraminal stenosis was identified at any visualized level to explain the patient's symptoms.    She filled out a pain tracker, according to which scores on PHQ-9 depression scale and GAD-7 anxiety scale were not very high, 9/27 and 6/21, correspondingly.  Her PTSD score was not measured, although she denied any emotional traumas or any abuse issues.  Stop-bang screening questionnaire for sleep apnea was 3/4, and in general, she did  endorse snoring.  She was not sure how loudly or how frequently she snores, and I asked her to ask her husband to pay more attention to that and then report to her primary care team.    REVIEW OF SYSTEMS     Review of systems was done, and her positive symptoms were corresponding to past medical history and history of present illness.    PAST MEDICAL HISTORY     1. Chronic cervicalgia.  2. Hypertension.  3. Migraine headaches.  4. Insomnia.  5. Snoring.    PAST SURGICAL HISTORY  She had as I mentioned C4-C5 anterior cervical fusion in June 2015.    Medications ad allergies have been reviewed and recorded.    SOCIAL HISTORY     She currently does not work.  Does not smoke.  No history of drug or alcohol abuse.  According to the patient, she was studying for physical therapy assistant, while in [place] around early 2015, but then she had to quit because of the onset of her cervicalgia.  Her husband is in [employer], and she has one 48 year old son, and her parents-in-law live with them currently, and she said that they are quite helpful and supportive.    FAMILY HISTORY     Noted for anxiety, asthma, and depression as well as migraine headaches in her mother and cancer and GI problems in her father.  She does not have siblings.    PHYSICAL EXAMINATION     VITAL SIGNS:  162/93, 104 but then became 90, and 14 respirations.  I emphasized that she has high blood pressure numbers.  GENERAL:  She is alert and oriented, in no distress.  Rates her pain at 7 to 8 out of 10.  PSYCHIATRIC:  Affect was somewhat on the flat side but then gradually it improved.  She became much more interactive and answered questions appropriately and was pleasant.  HEENT:  Pupils equal, round.  Extraocular movements normal.  CENTRAL NERVOUS SYSTEM:  Cranial nerves II-XII grossly intact.  RESPIRATORY:  Respirations unlabored.  CARDIAC:  Pulse rhythm and rate regular.  MUSCULOSKELETAL:  She ambulates independently with normal gait.  She has equal and  symmetric strength in upper extremities, but she seems to be somewhat deconditioned.  Biceps, triceps, and brachioradialis reflexes are 1+ throughout.  There was some sensory decrease in all of her fingers on the left in a nondermatomal distribution, but again, strength was symmetric.  Range of motion in shoulders bilaterally symmetric practically normal, although she has to do it slowly, and then, there is some limitation on flexion and extension, left and right lateral rotation in her cervical spine, but again, she does all those maneuvers.  SKIN:  Without any rashes or lesions on the inspected skin areas such as arms, forearms, hands, neck, face.    STUDIES     Then electrodiagnostic study as I mentioned, in October 2015 was normal.    ASSESSMENT AND PLAN     Donna Wright is a pleasant, 39 year old female with ongoing chronic pain essentially since March of 2015, who had then surgery as mentioned anterior C4-C5 fusion, and she has been frustrated with ongoing pain, which has not abated as she expected to happen with her surgery, and she has been on opioid medications, has not done much of a functional rehabilitation workup at this time, although she says that she is quite open to this.  Neither has she been in any pain education classes or any pain-related counseling, although she says that she is open to this as well.    RECOMMENDATIONS     1. We had a discussion about more broader approaches to chronic pain, and the patient told us that this is not the first time that she has been told about this.  However, she said that maybe a discussion that we had was quite comprehensive, and also we talked about the fact that since analogous or close to our recommendations have been made by other providers as well, then probably there is something promising and potentially effective in those recommendations, although obviously  they need to be followed and acted on.  Donna Wright seemed to be agreeing with this opinion.  2. She  said that she has referral to counseling outside [employer].  She is looking forward to this.  She has not seen yet that clinic but hopefully in a week or two, and we strongly supported that.  She had quite good effect back in 2009 and 2010 when she attended counseling for a year and a half after her gastric bypass, although she did not have chronic pain at that point, but hopefully chronic pain can be addressed as well together with some frustration and anxiety that the patient experiences.  3. We have also discussed the physical aspects of rehabilitation would be very important.  She does not do enough.  It is my impression and she agreed.  Therefore, we have discussed 3 types of options.  All 3 in fact can be done, although some pacing and gradual approach needs to be exercised. A)  Obviously walking, which is simplest of exercises.  She has 2 dogs, and we recommended that she makes this regular activity, but also involves in this her husband and her 20 year old son.  Possibly all of them can do some walking either in the evenings or on weekends.  They have not really done that up until now, but she liked the idea. B)  Secondly, I think that a gentle type yoga can be useful, and I gave her resources for that which is typed in her discharge summary that is Easy Yoga For Easing Pain, and this is a DVD by [employer].  She can either buy it or borrow  it from [employer]. C)  Finally yet another gentle type exercise program that I showed her both in fact on the website and the second is called Sit and Be Fit program, which also either she can look up from DVD collection that they have or in fact watch it every day on weekdays on PBS.  4. I asked her to speak with her husband to pay more attention to whether she snores loudly and whether she snores frequently and then if that would be the case then probably some sort of sleep study will be recommended to rule out obstructive sleep apnea.  5. I gave her a printout  of Pain Survival Guide, a book by Dr. Donzetta Kohut, a pain psychologist here at the [facility].  She said that she likes reading and so then I think this book can be useful reading  for her.  6. Again, along the lines of pain physical rehabilitation in addition to walking, gentle yoga, and Sit and Be Fit, she also said that she would like very much if there was an opportunity to do some aquatic therapy and so that I wanted to bring this to her primary care team's attention if there is a possibility somewhere in [place] or maybe close by in the [place] area.  7. Then finally in terms of medications, I believe that specific doses can be decided and sorted out between Donna Wright and her primary care team.  However, overall, she said that she would like with time not to be on opioid medications and that would be our opinion as well in terms of long-term strategies.  However, for now obviously she is on up to 30 mg of hydrocodone a day, and we think that if those broader strategies can be put in place, and if the  patient would be open and willing to pursue those and hopefully with time her opioids can be reduced.  However, we do recommend that when reduction is done it is done very slowly and in collaboration with the patient.

## 2014-05-29 ENCOUNTER — Encounter (HOSPITAL_BASED_OUTPATIENT_CLINIC_OR_DEPARTMENT_OTHER): Payer: Self-pay | Admitting: Pain Management

## 2014-05-29 DIAGNOSIS — R0683 Snoring: Secondary | ICD-10-CM | POA: Insufficient documentation

## 2014-05-29 DIAGNOSIS — G4700 Insomnia, unspecified: Secondary | ICD-10-CM | POA: Insufficient documentation

## 2014-05-29 DIAGNOSIS — M542 Cervicalgia: Secondary | ICD-10-CM | POA: Insufficient documentation

## 2020-11-02 IMAGING — MG MAMMO SCRN BIL W/CAD TOMO
8 series · 8 of 24 positions shown · non-contrast
Comparison: No prior imaging studies are available for comparison.

Images Obtained from [facility]
INDICATION: Screening.
TECHNIQUE: Bilateral 2-D digital screening mammogram was performed followed by 3-D tomosynthesis.  Current study was also evaluated with a computer aided detection (CAD) system.
MAMMOGRAM FINDINGS:
The breasts are almost entirely fatty.
No suspicious abnormality is seen in either breast.

[L MLO]
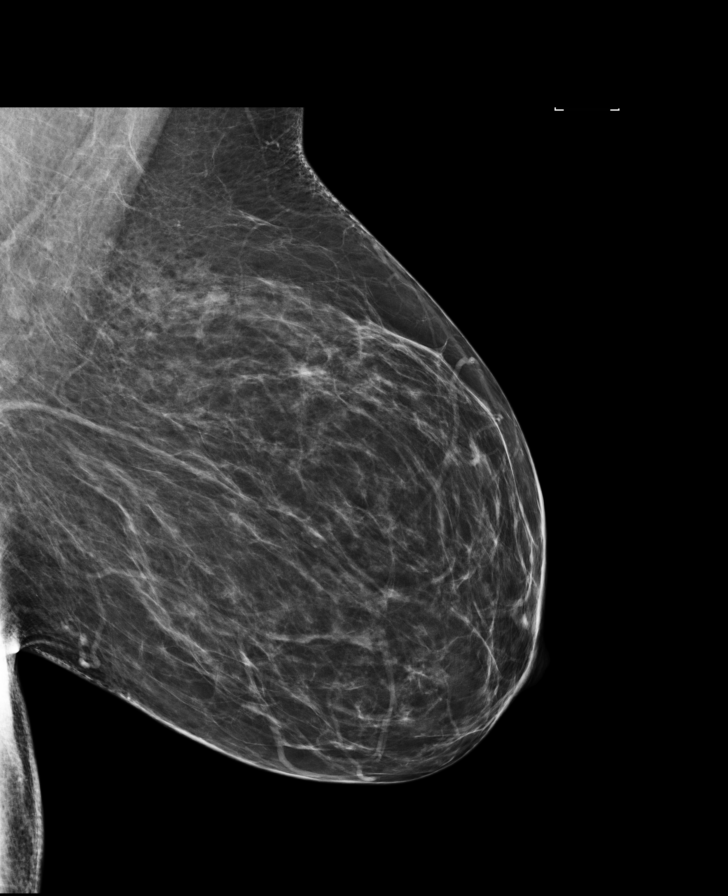

[L CC]
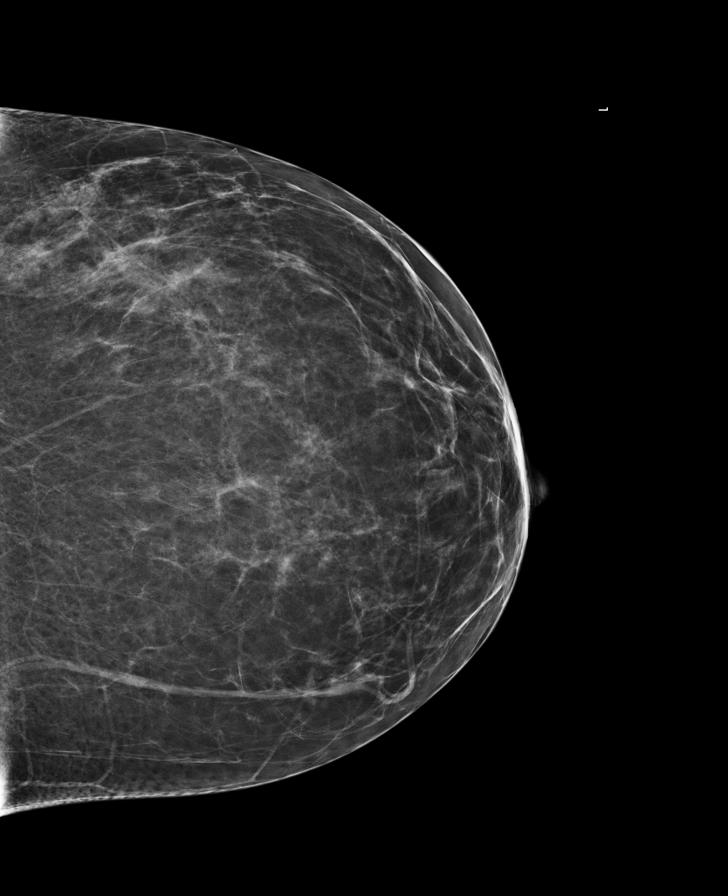

[R MLO]
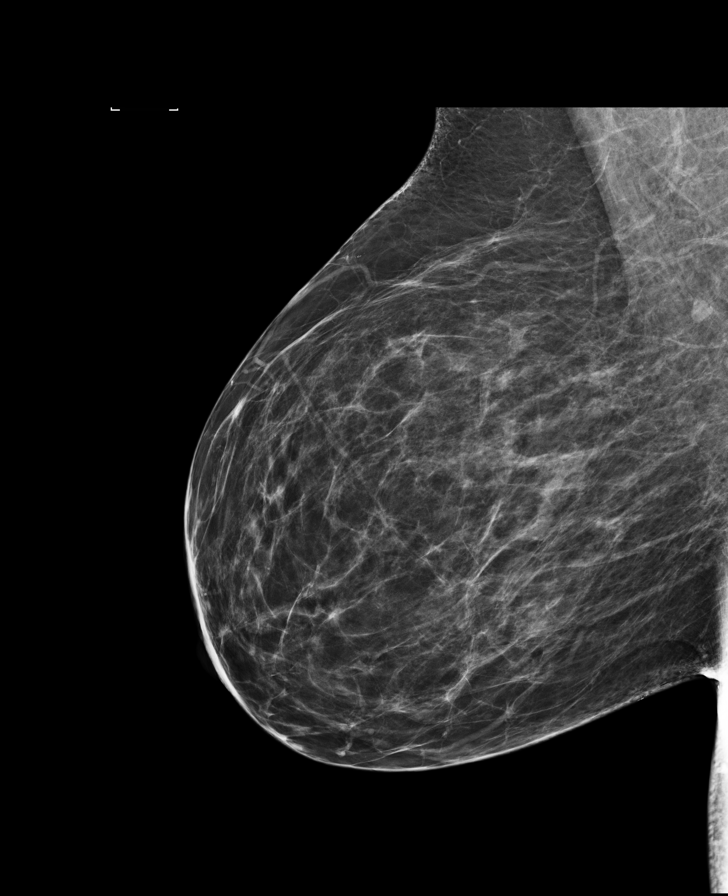

[R CC]
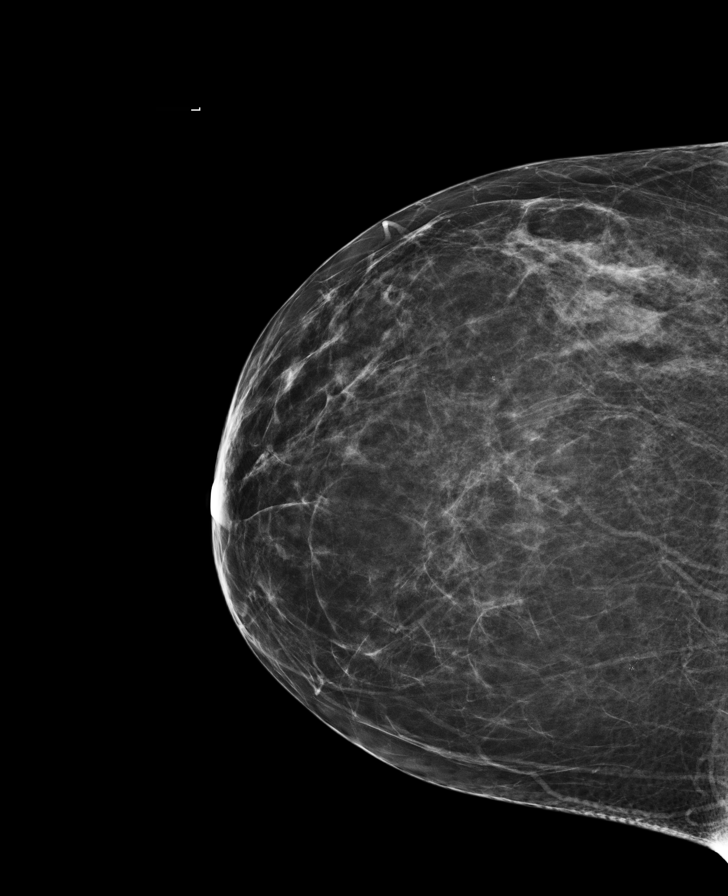

[R MLO tomo · tomo slice 39/77.0]
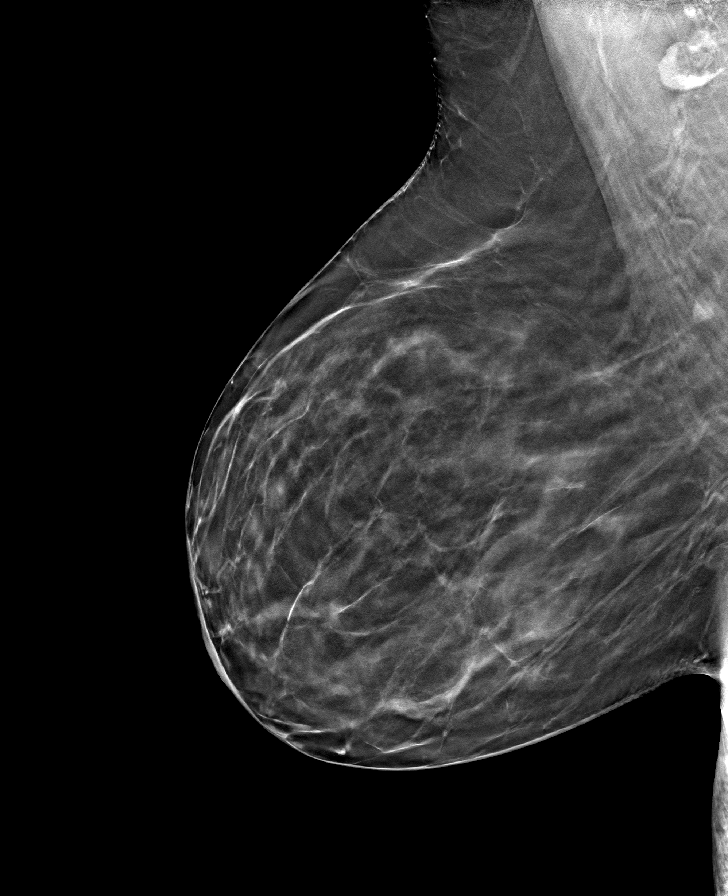

[L MLO tomo · tomo slice 40/79.0]
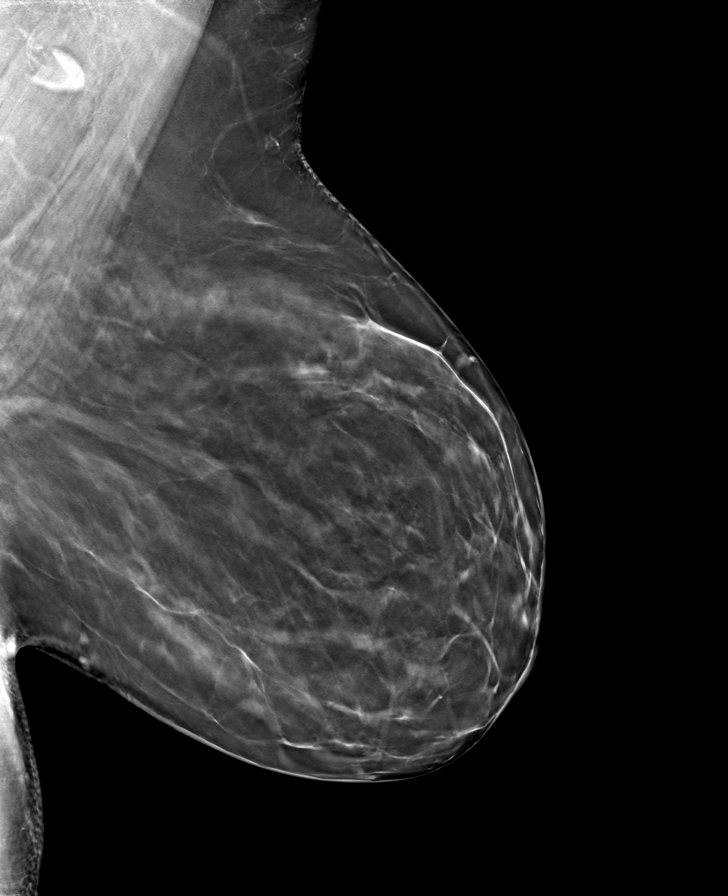

[L CC tomo · tomo slice 31/61.0]
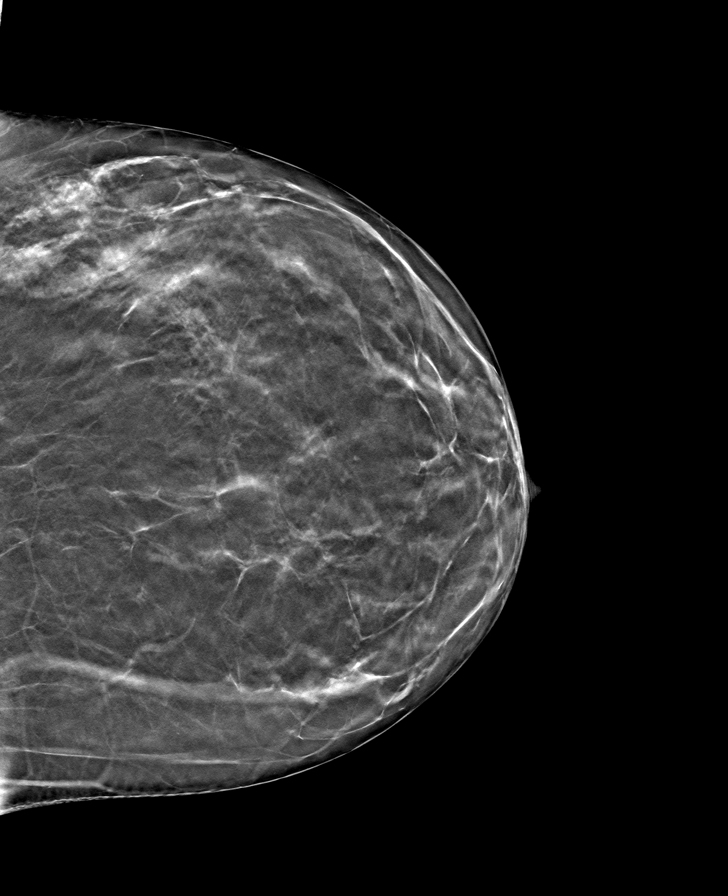

[R CC tomo · tomo slice 33/64.0]
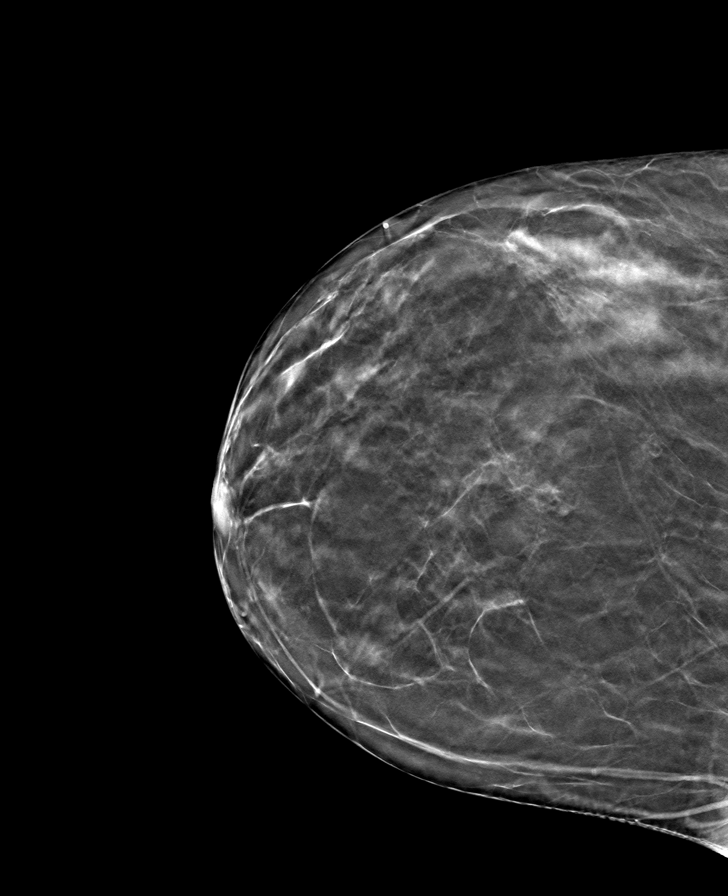

[8 of 24 positions shown; findings below may reference images not displayed]

IMPRESSION: There is no mammographic evidence of malignancy.
Screening mammogram recommended in 1 year.
BI-RADS Category 1: Negative

## 2020-11-02 IMAGING — US US ABDOMEN COMPLETE
1 series · 14 of 25 positions shown · non-contrast
Comparison: none

[Series 1: us abdomen complete · 14 of 70 slices shown]
[im 1/70]
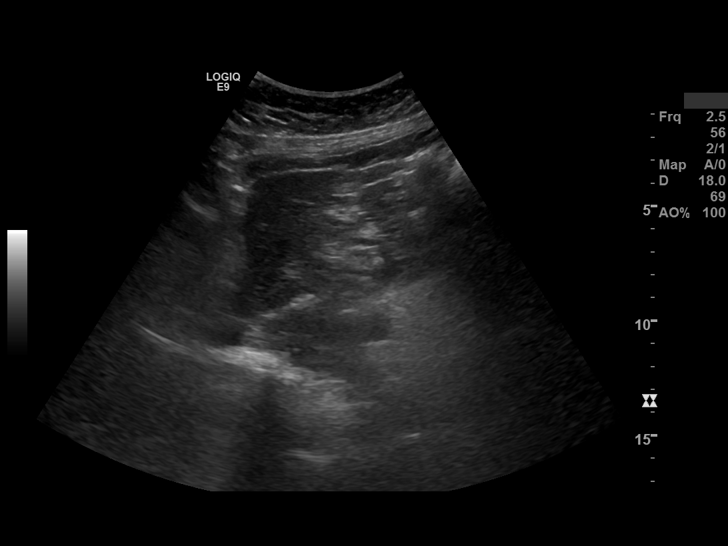
[im 6/70]
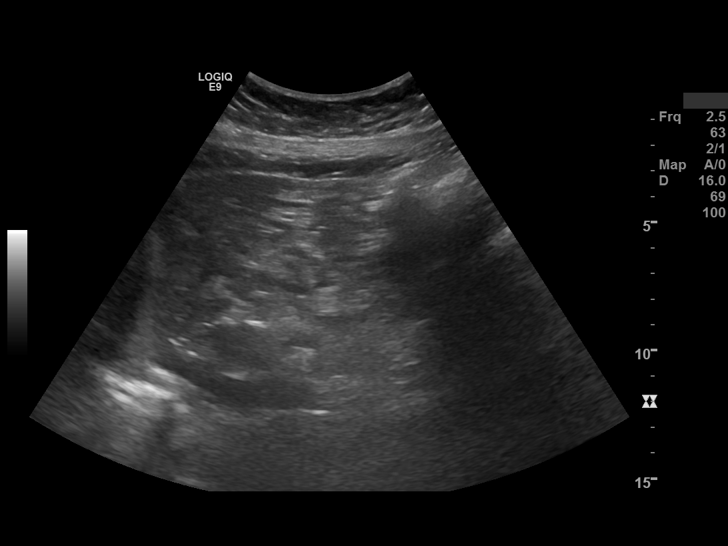
[im 12/70]
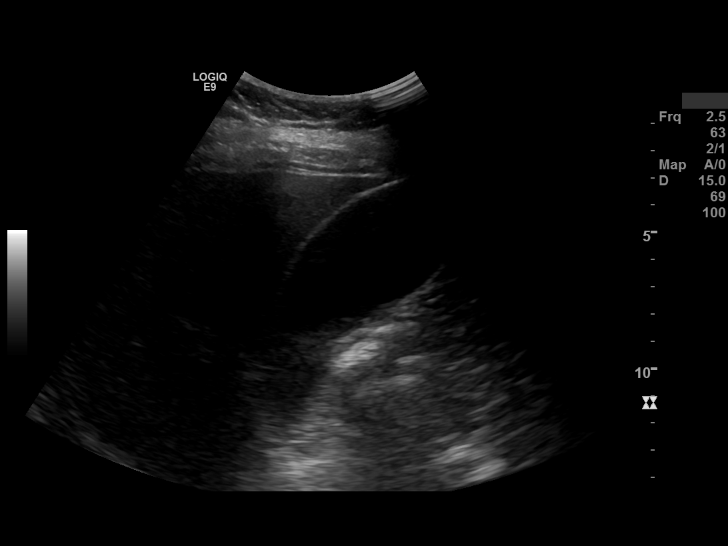
[im 18/70]
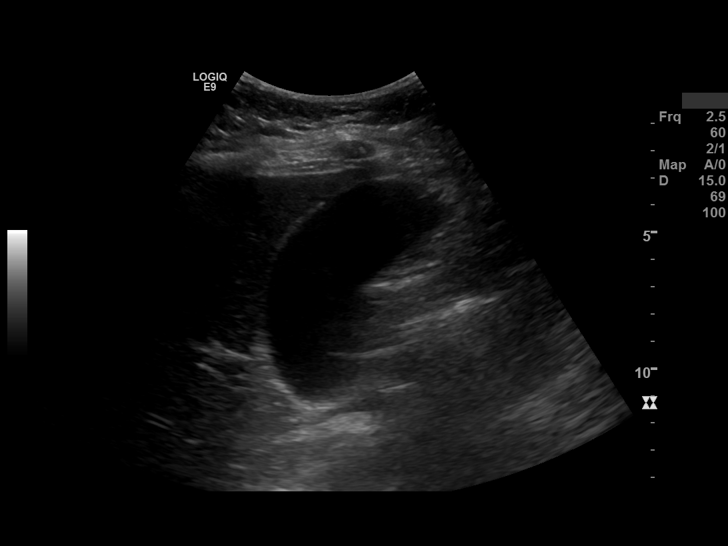
[im 24/70]
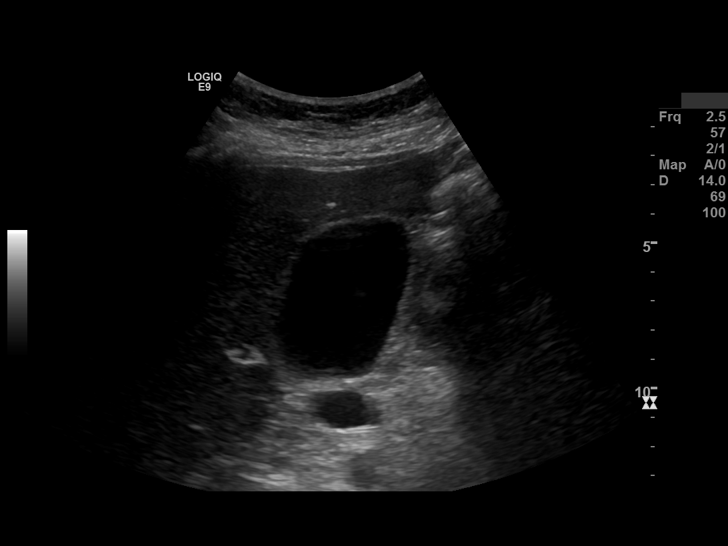
[im 26/70]
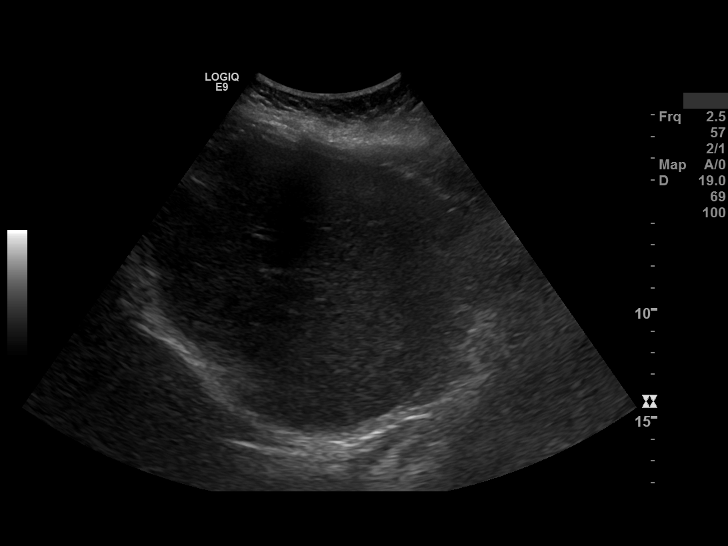
[im 32/70]
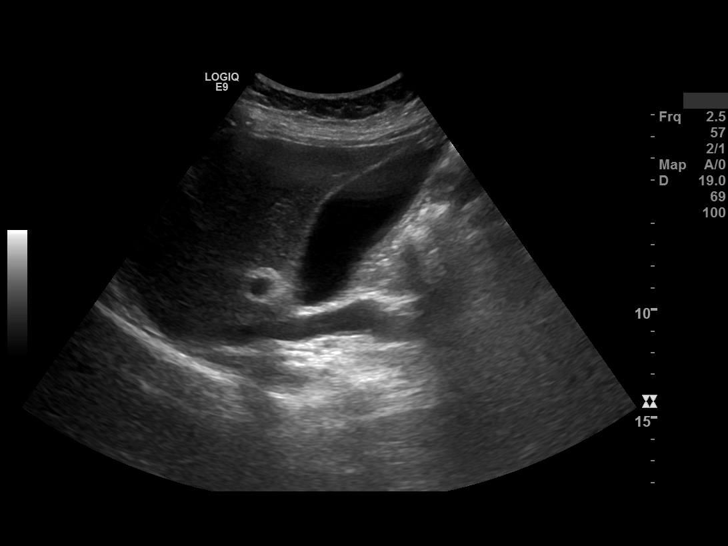
[im 38/70]
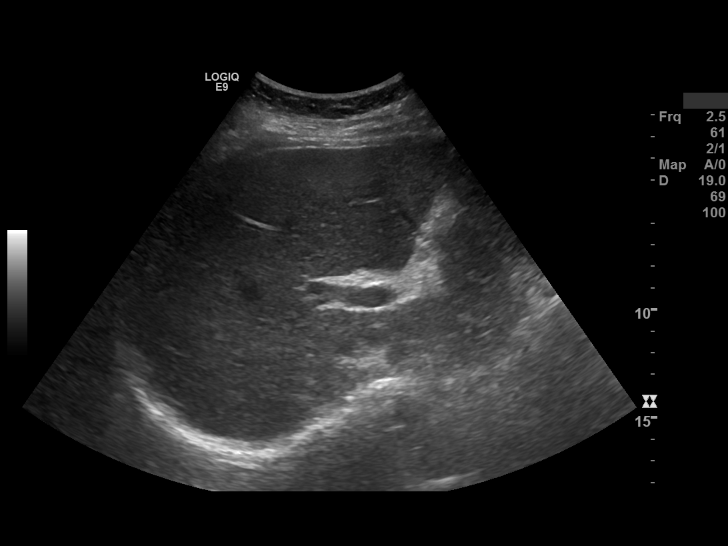
[im 44/70]
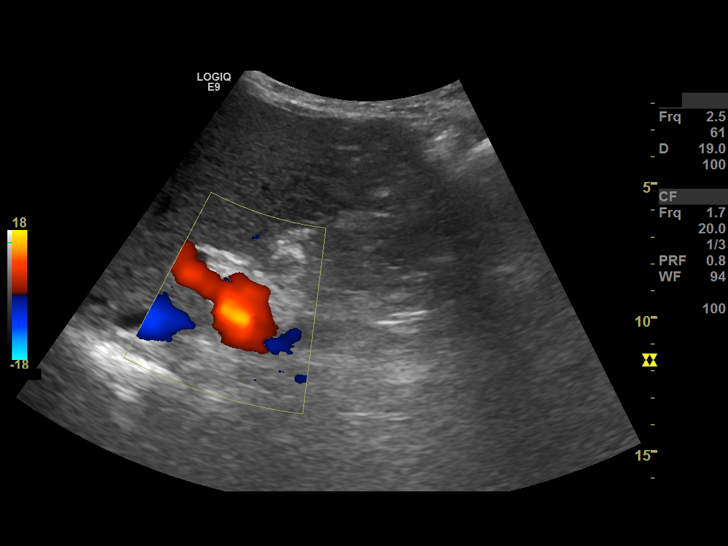
[im 47/70]
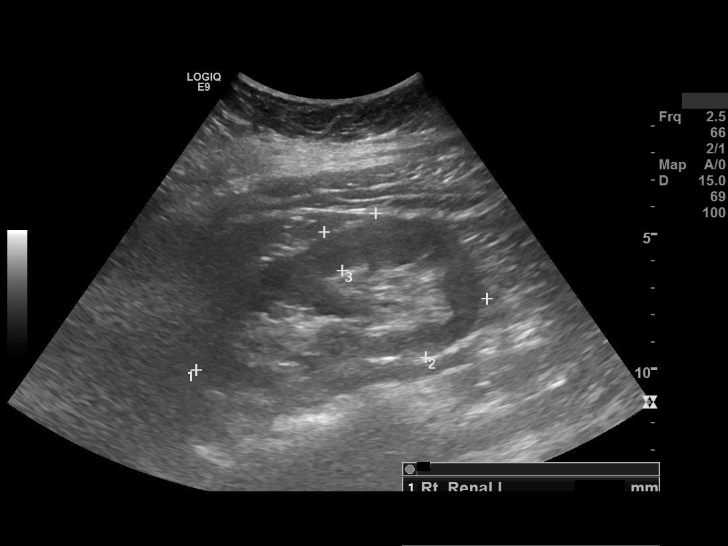
[im 52/70]
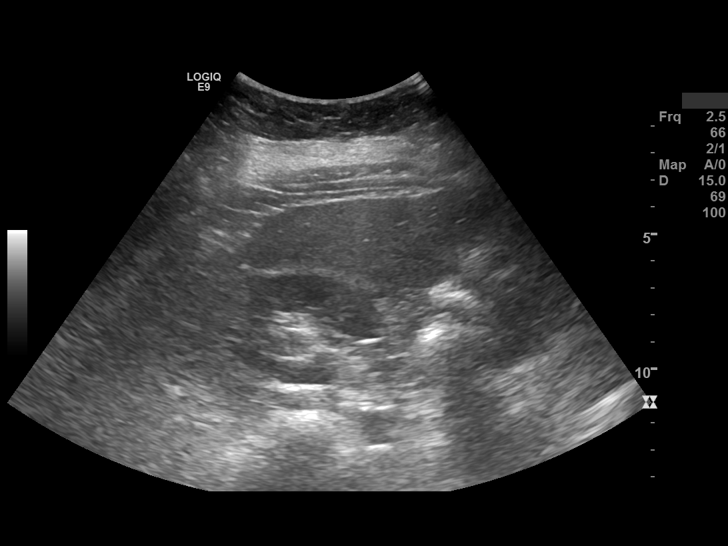
[im 58/70]
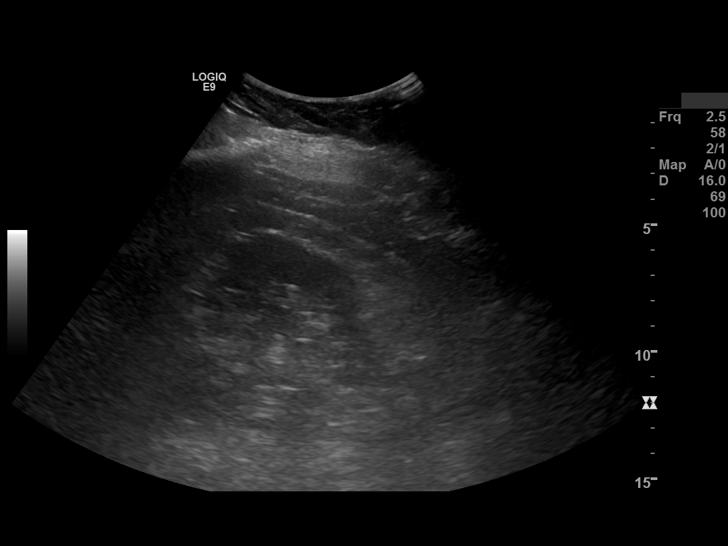
[im 64/70]
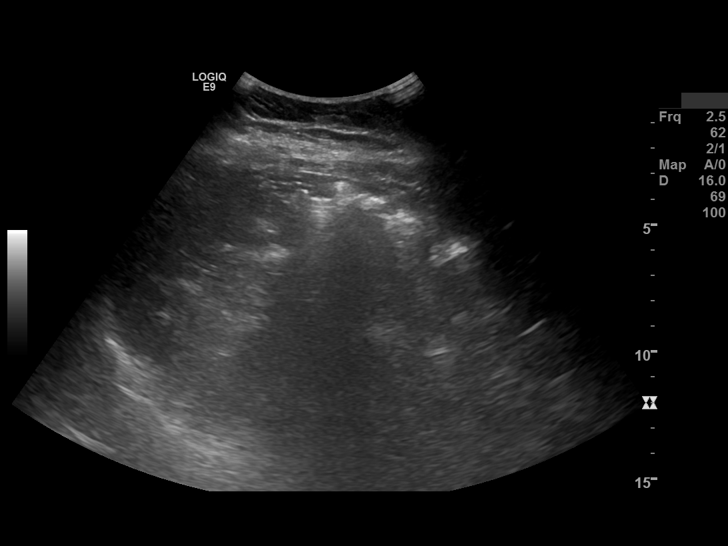
[im 70/70]
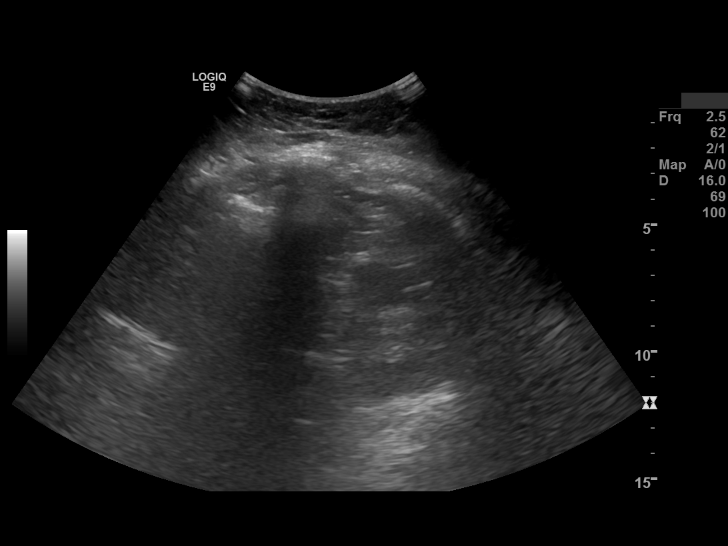

[14 of 25 positions shown; findings below may reference images not displayed]

REASON FOR EXAM

*
Thrombocytosis, elevated LFTs 

COMPARISON

*
None

TECHNIQUE

*
Multiple gray-scale and color-flow images of the abdomen were obtained with ultrasound

*
Exam is limited by bowel interference, patient body habitus, lack of acoustic windows, and difficulty with breath-holding

FINDINGS

Aorta, IVC, Pancreas

*
Visualized portions are normal

Liver

*
163 mm

*
Normal echogenicity 

*
No focal lesion

*
Hepatopetal flow in the portal vein

Gallbladder

*
No stones

*
Trace sludge

*
Normally distended

*
Wall thickness 3.8 mm

*
No pericholecystic fluid

*
No sonographic Murphy sign

Common Bile Duct

*
Diameter 2.72 mm

Spleen

*
33 x 86 x 34 mm

Right Kidney

*
56 x 111 x 47 mm

*
No hydronephrosis

Left Kidney

*
64 x 97 x 59 mm

*
No hydronephrosis

Ascites

*
None

IMPRESSION

*
Negative abdominal ultrasound

*
Unremarkable sonographic appearance of the liver and spleen

## 2021-02-16 IMAGING — CR CHEST 2 VWS PA LAT
1 series · 2 of 2 positions shown · non-contrast
Comparison: None

HISTORY: Acute cough and congestion for three days.
TECHNIQUE: PA and lateral chest radiographs

[Series 1: PA · 0.17mm/px · 2 of 2 slices shown]
[im 1/2]
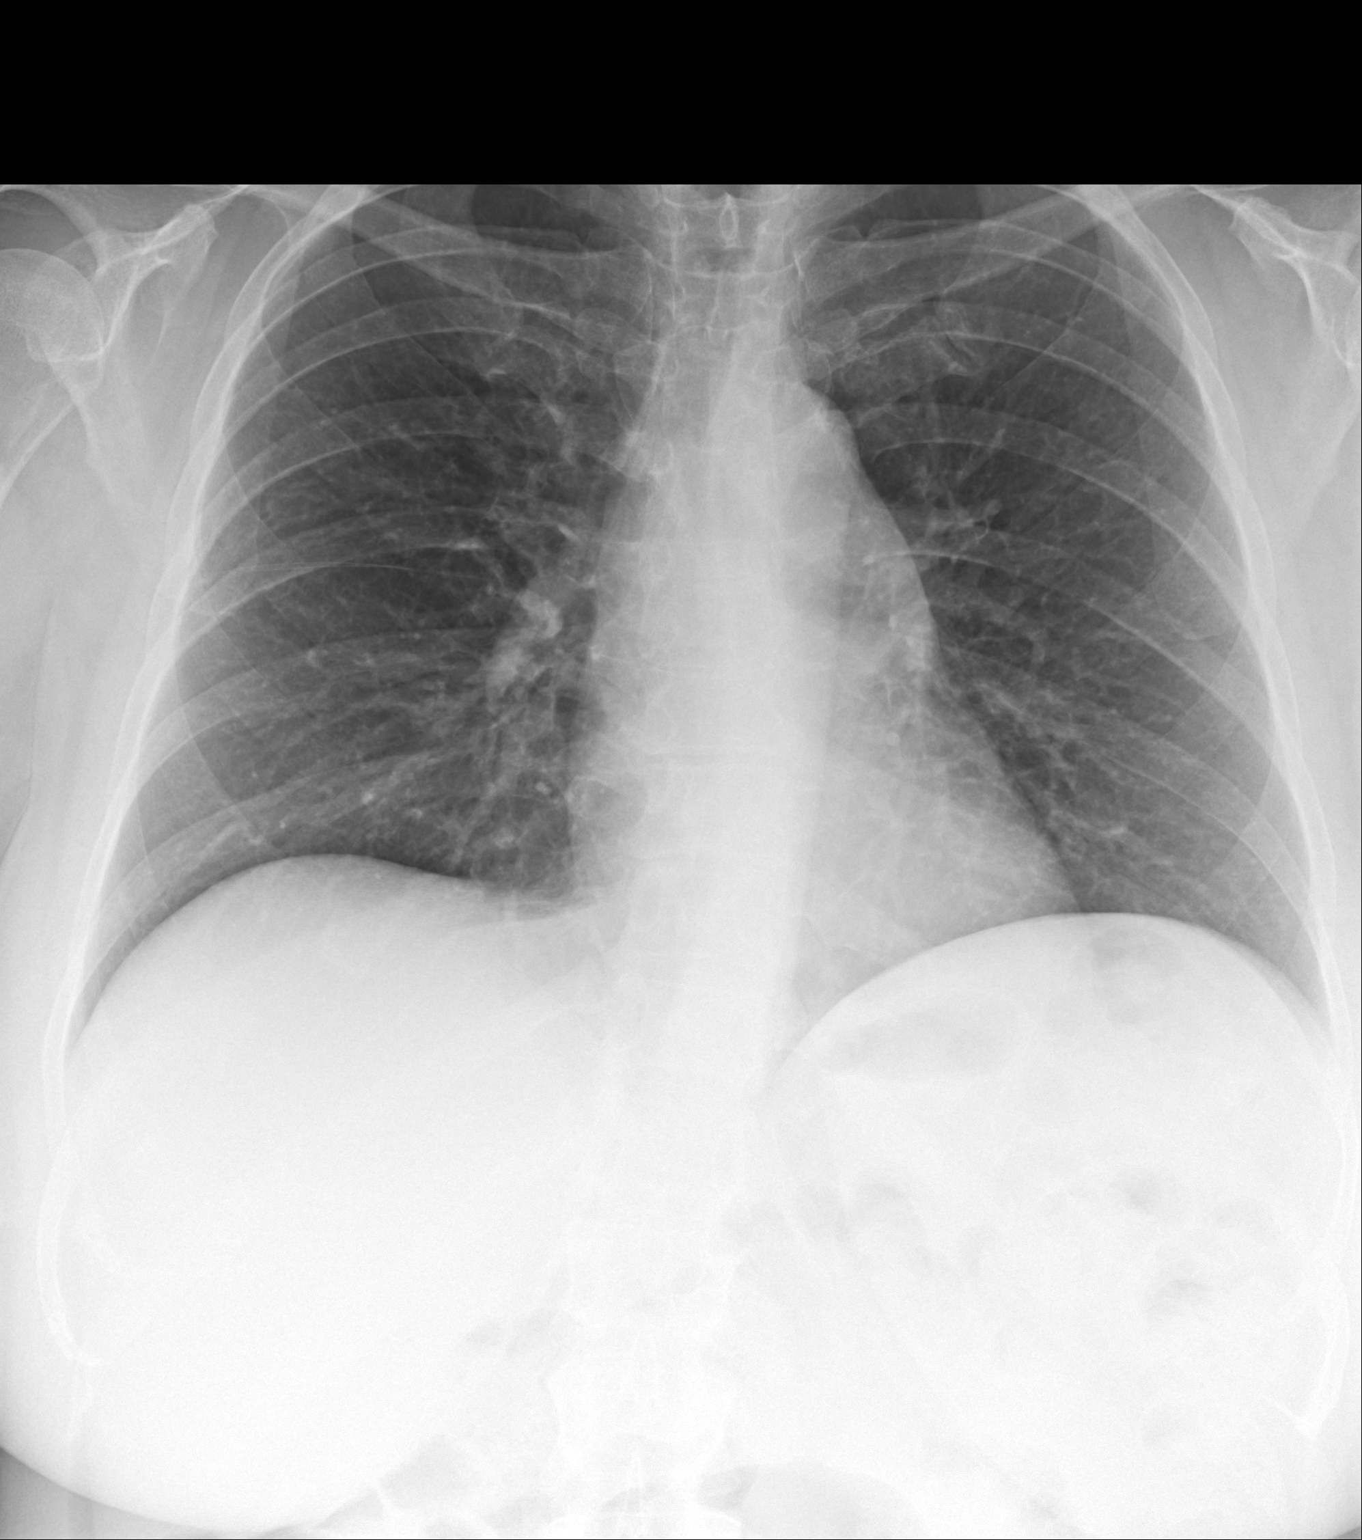
[im 2/2]
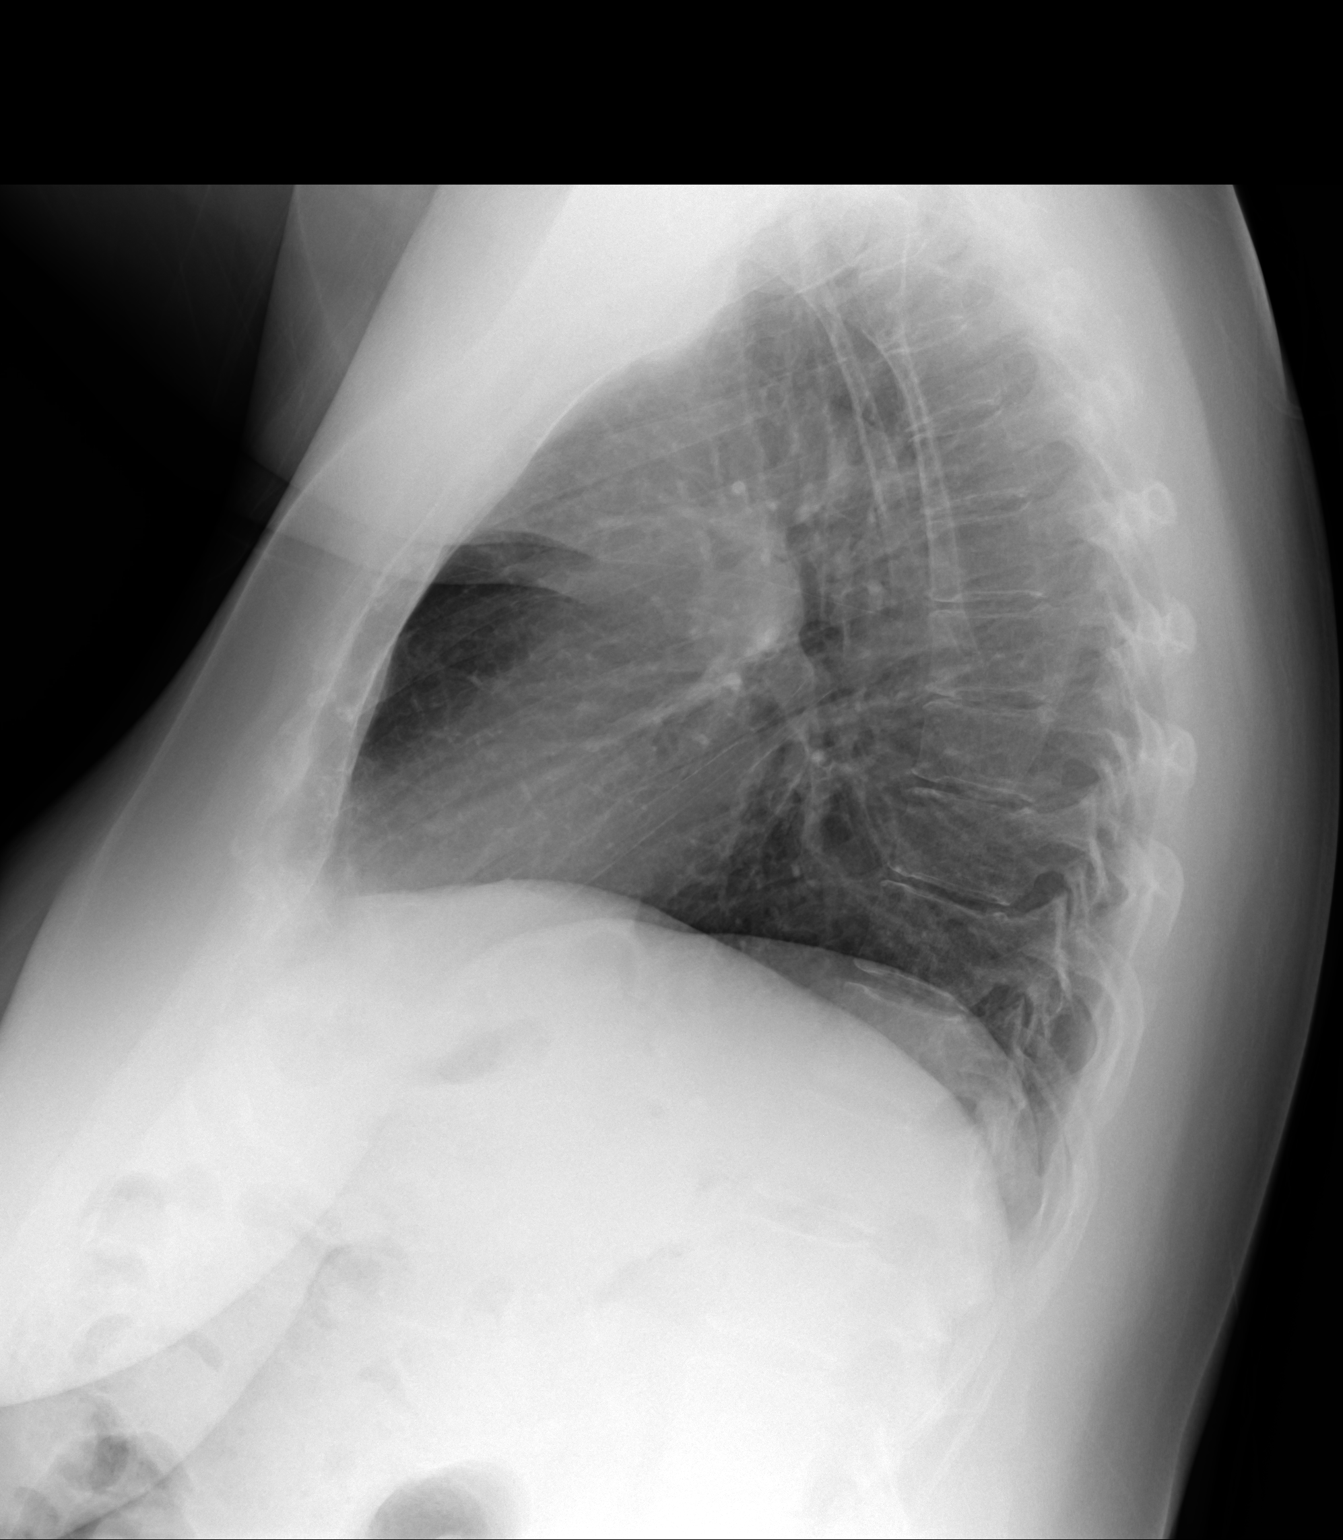

[2 of 2 positions shown; findings below may reference images not displayed]

FINDINGS: Heart size is normal. No pleural effusion or pneumothorax. No consolidation.
IMPRESSION: No acute chest findings.

## 2021-08-15 IMAGING — CR C-SPINE W/ Flex Extend 6 or more views
1 series · 8 of 8 positions shown · non-contrast
Comparison: None.

REASON FOR EXAM: Radiculopathy.

[Series 1: oblique · 0.17mm/px · 8 of 8 slices shown]
[im 1/8]
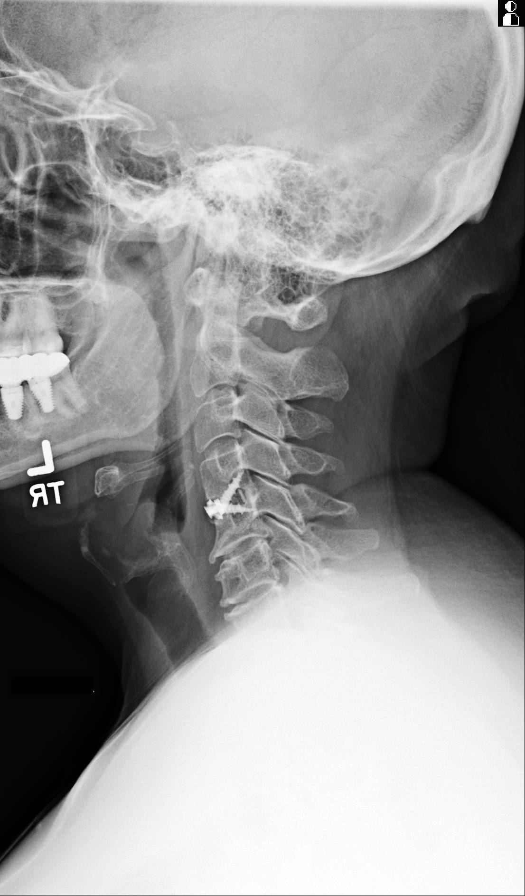
[im 2/8]
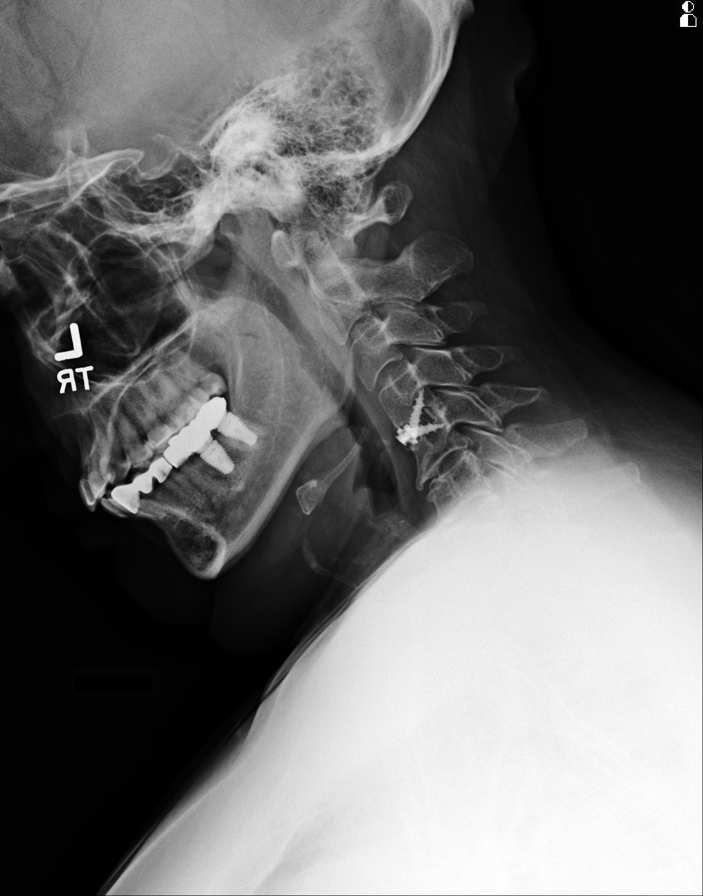
[im 3/8]
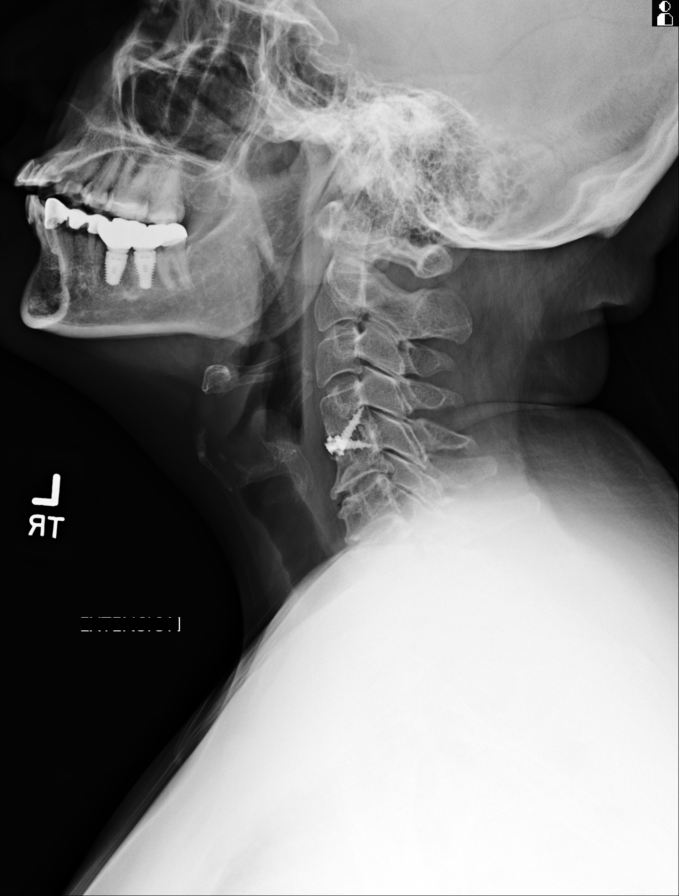
[im 4/8]
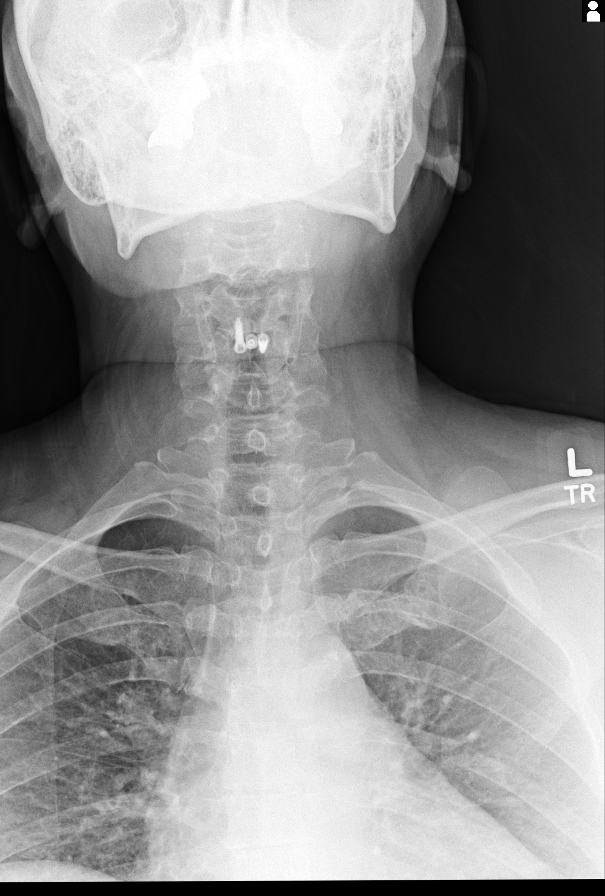
[im 5/8]
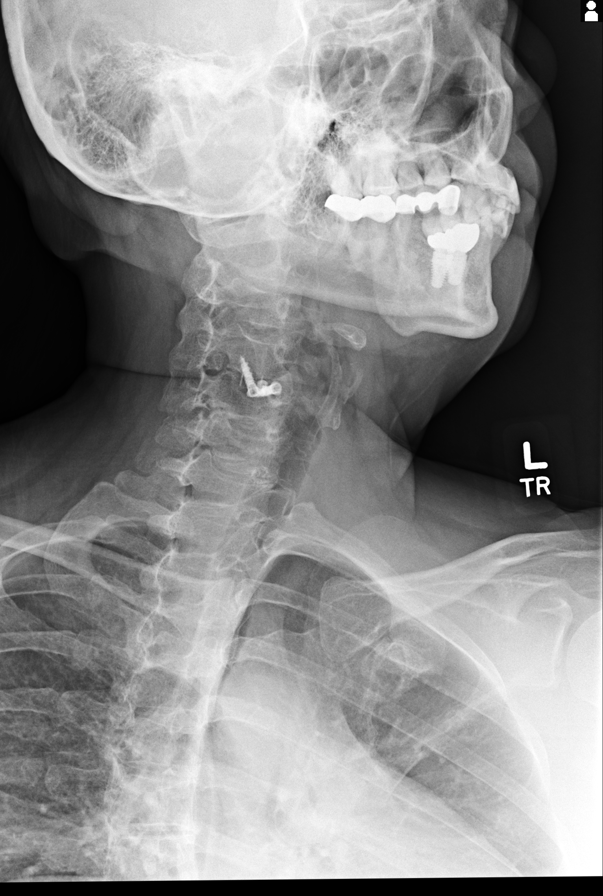
[im 6/8]
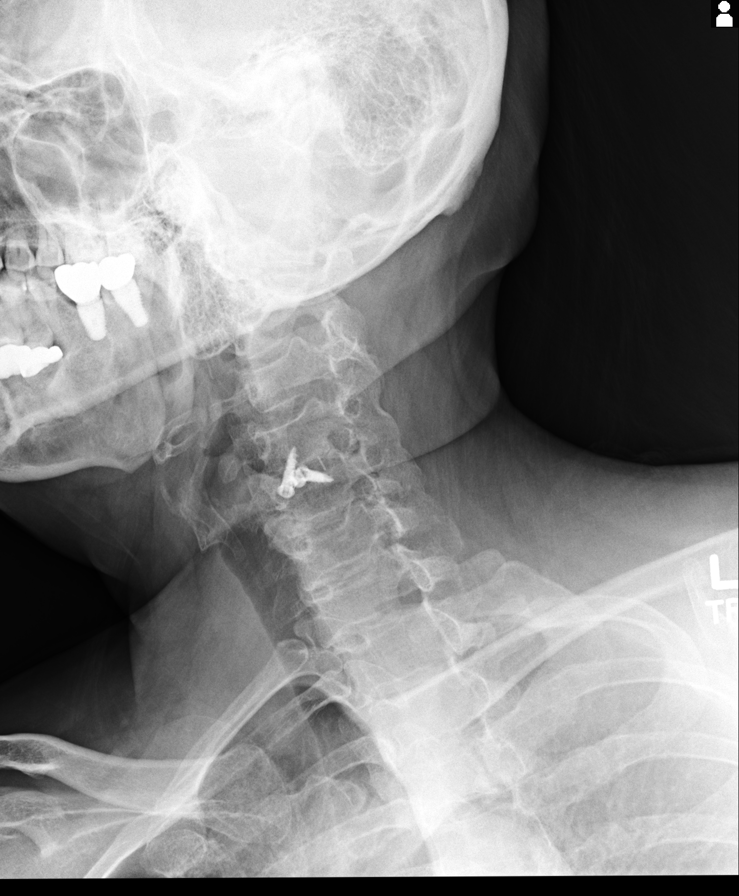
[im 7/8]
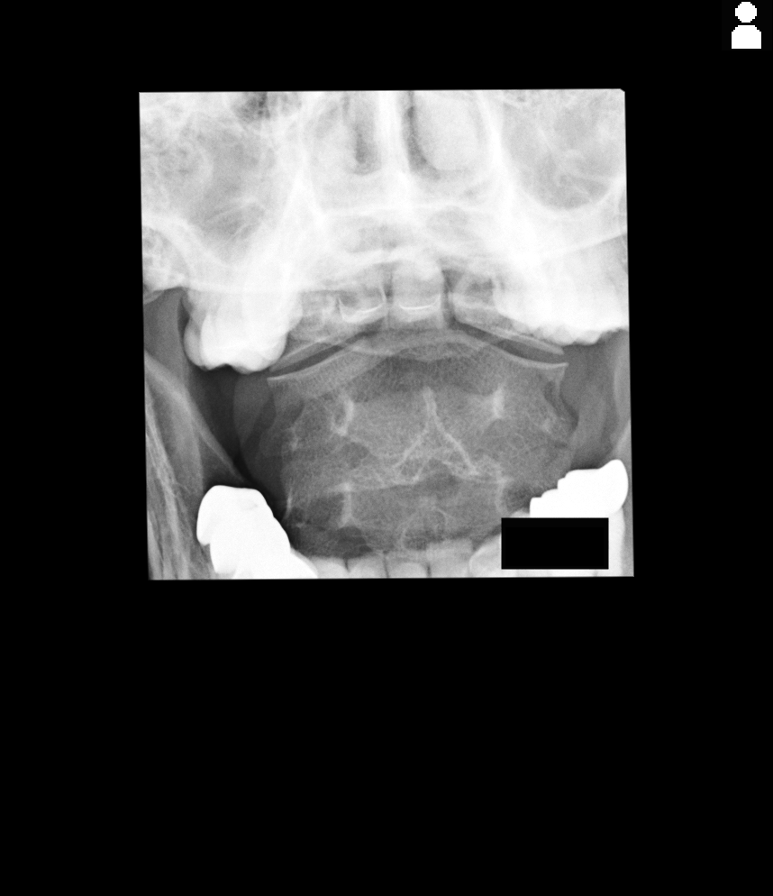
[im 8/8]
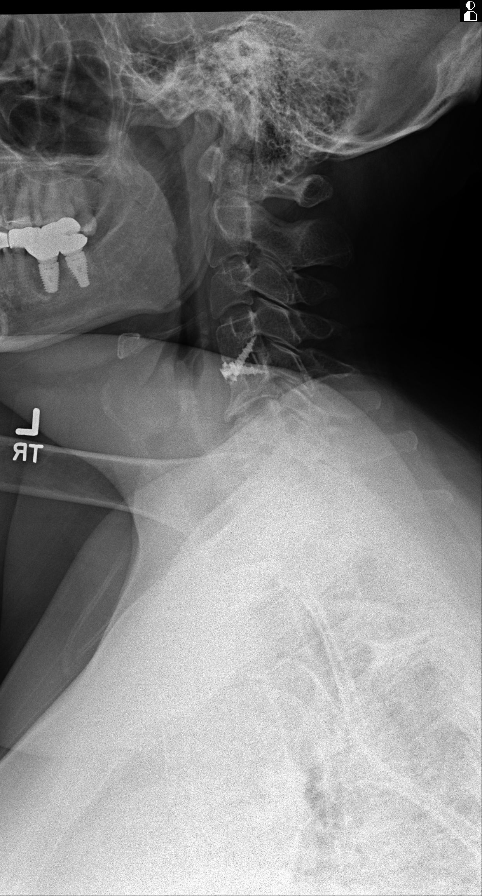

[8 of 8 positions shown; findings below may reference images not displayed]

TECHNIQUE AND FINDINGS: 7 views of the cervical spine were obtained including flexion and extension lateral views. The patient is status post fusion of C4-C5 with trans-disc vertebral body screws and disc spacer material. The vertebral bodies have a grossly normal height and alignment. There is mild disc space narrowing at C5-C6 with posterior osteophytes which mildly encroach upon the anterior spinal canal. Anterior osteophytes are seen at C5-C6 as well. Disc narrowing at C6-C7 is also present. Flexion and extension views do not show any evidence of instability. Prevertebral soft tissues are normal. Base of dens is intact.
IMPRESSION: 1. Postsurgical changes of C4-C5 fusion.

2. Degenerative disc disease C5-C6 and C6-C7.

## 2021-08-15 IMAGING — MR MRI CSPINE WO CONTRAST
4 of 5 series · 18 of 48 positions shown · IV contrast (agent unspecified)
Comparison: Cervical spine radiographs from same day.

HISTORY: 45-year-old female with cervical radiculopathy, spinal stenosis and post-laminectomy syndrome.
TECHNIQUE: Multiplanar, multisequence MR images of the cervical spine were obtained without intravenous contrast. 

CONTRAST: None.

[Series 5: t2_tse_sag · sagittal · 3.0mm · 0.57mm/px · 6 of 15 slices shown]
[im 1/15]
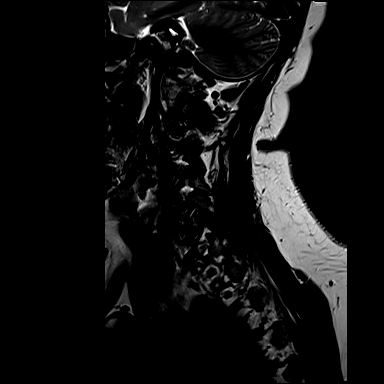
[im 3/15]
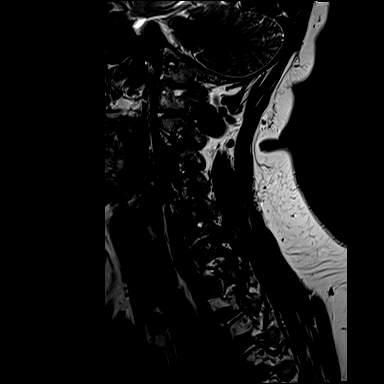
[im 6/15]
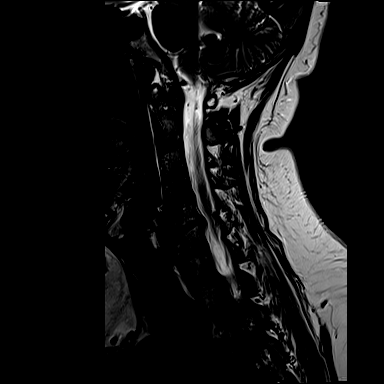
[im 9/15]
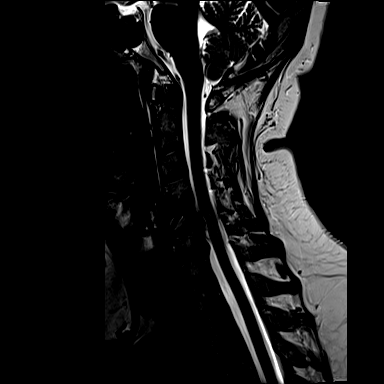
[im 12/15]
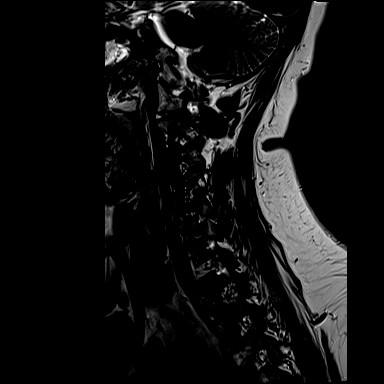
[im 15/15]
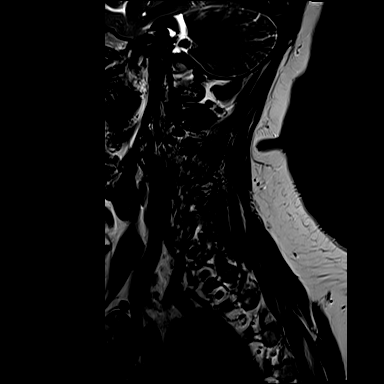

[Series 6: t1_tse_sag_p2 · sagittal · 3.0mm · 0.34mm/px · 6 of 15 slices shown]
[im 1/15]
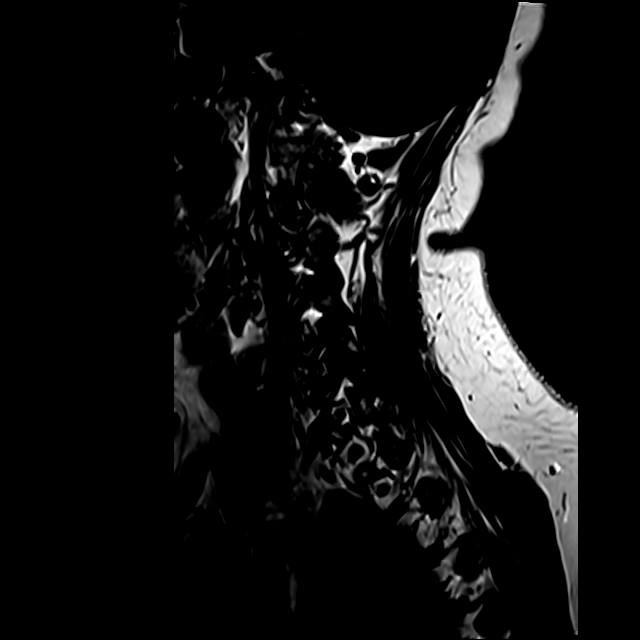
[im 3/15]
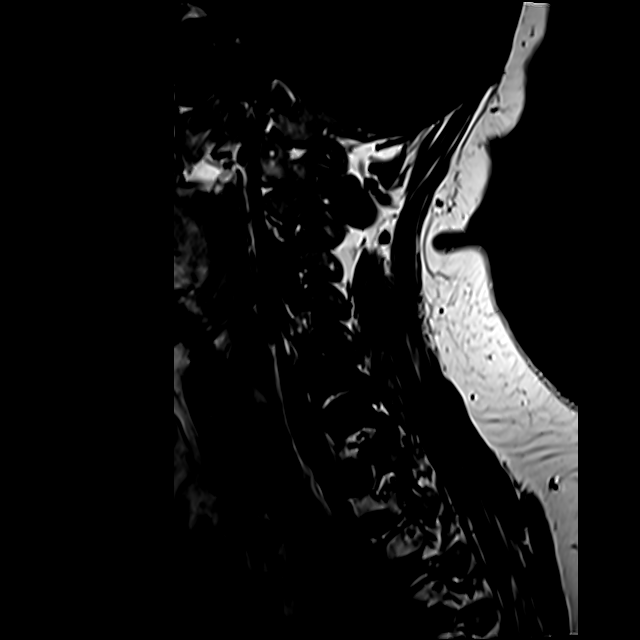
[im 6/15]
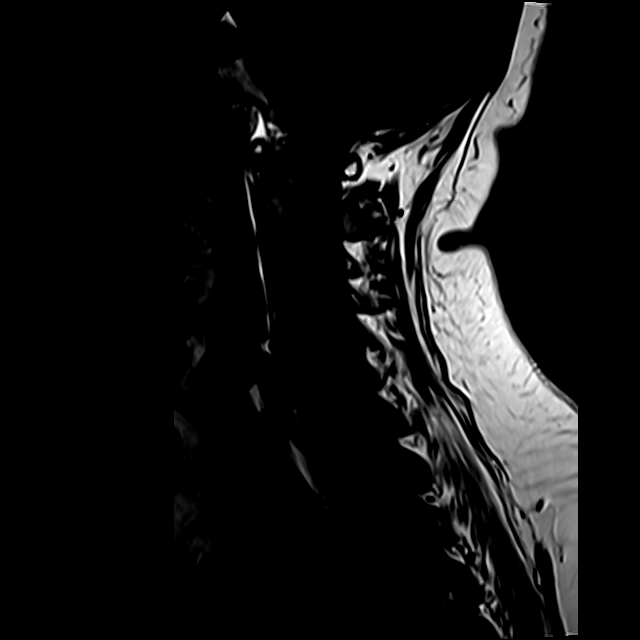
[im 9/15]
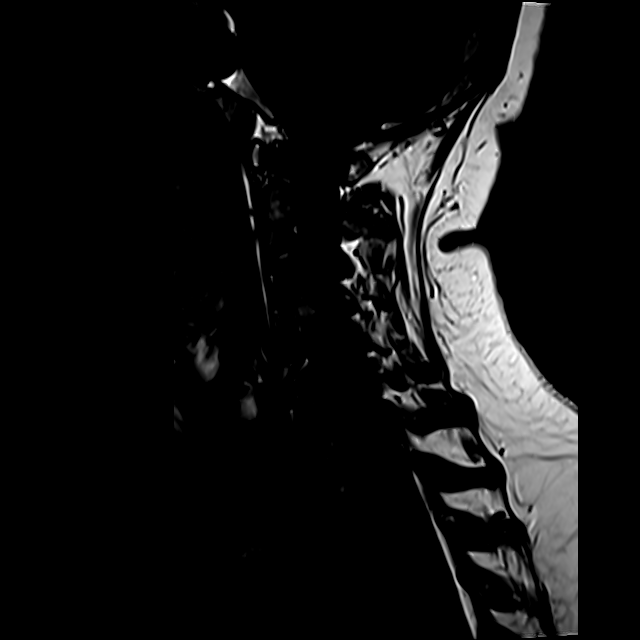
[im 12/15]
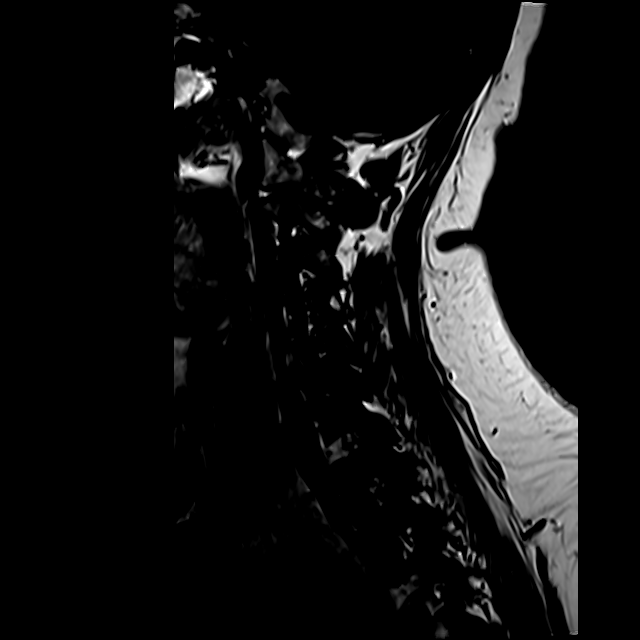
[im 15/15]
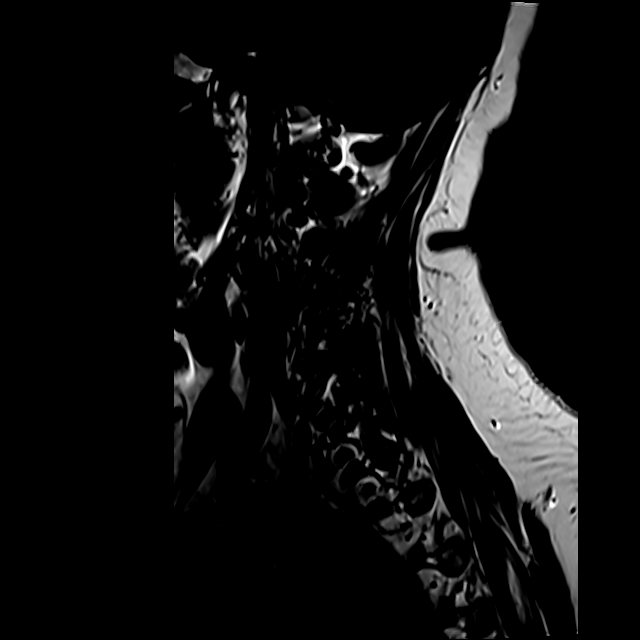

[Series 7: stir_sag_p2 · sagittal · 3.0mm · 0.34mm/px · 3 of 15 slices shown]
[im 3/15]
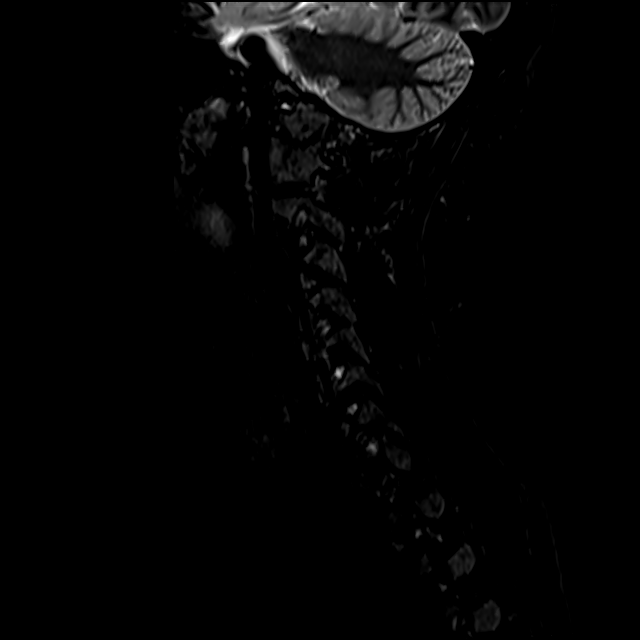
[im 9/15]
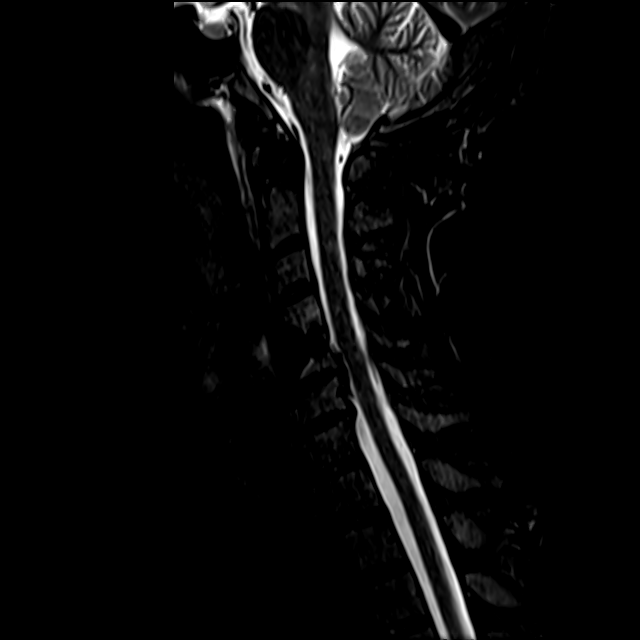
[im 15/15]
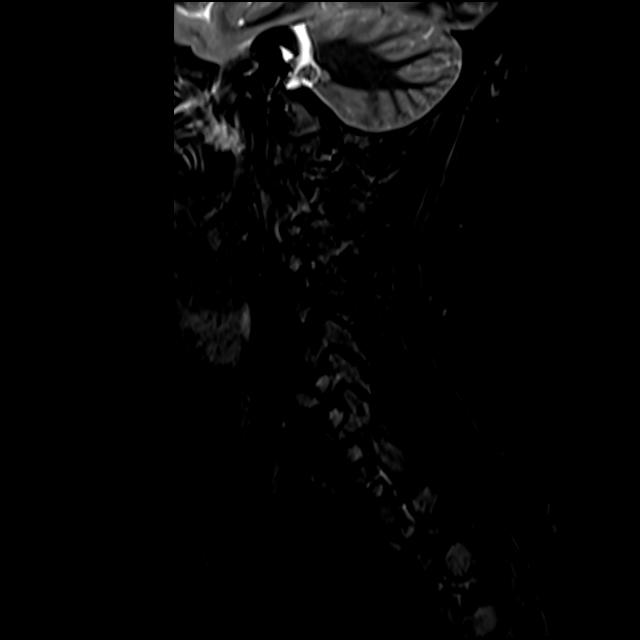

[Series 8: t2_(person_name)_(person_name)_(person_name) · axial · 3.0mm · 0.56mm/px · z∈[-65,+21]mm · 3 of 30 slices shown]
[im 3/30]
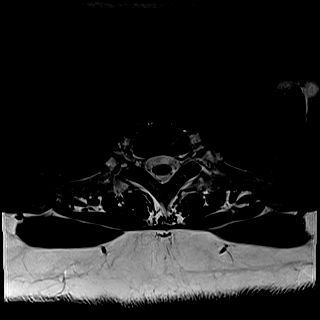
[im 15/30]
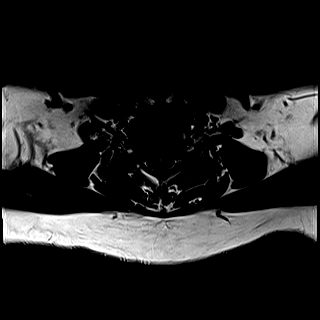
[im 27/30]
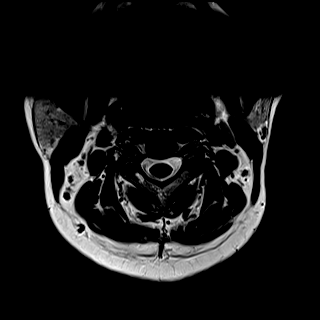

[18 of 48 positions shown; findings below may reference images not displayed]

FINDINGS: ALIGNMENT: Mild straightening of the cervical lordosis. No subluxations.

VERTEBRAE:  Previous fusion at C4-5. There is mild height loss and increased AP diameter of the C5-7 vertebral bodies, most likely degenerative in etiology. Multilevel degenerative endplate signal changes.

INTERVERTEBRAL DISCS: Multilevel disc desiccation with mild height loss at C5-6 and C6-7.

PARASPINAL SOFT TISSUES:  Allowing for metallic artifact, no significant paraspinal soft tissue signal abnormalities.

SPINAL CORD:  Mild mass effect on spinal cord at C5-6 without abnormal spinal cord signal.

VISUALIZED POSTERIOR FOSSA: Unremarkable.

Findings by level:

C2-C3:  No high-grade canal stenosis or foraminal narrowing.

C3-C4:  No high-grade canal stenosis or foraminal narrowing.

C4-C5:  No high-grade canal stenosis or foraminal narrowing.

C5-C6:  Uncovertebral and facet joint hypertrophy. Disc osteophyte complex, ligamentum flavum hypertrophy. Mild to moderate canal stenosis with mild mass effect on the spinal cord. Moderate to severe right foraminal narrowing. No high-grade left foraminal narrowing.

C6-C7:  Uncovertebral and facet joint hypertrophy. Mild to moderate left foraminal narrowing. No high-grade right foraminal narrowing or canal stenosis.

C7-T1:  No high-grade canal stenosis or foraminal narrowing.
IMPRESSION: 1.
Previous C4-5 fusion.

2.
Mild to moderate C5-6 canal stenosis with mild mass effect on the spinal cord without abnormal signal.

3.
Moderate to severe right C5-6 foraminal narrowing.

4.
Mild to moderate left C6-7 foraminal narrowing.

## 2022-02-08 IMAGING — MG MAMMO SCRN BIL W/CAD TOMO
8 series · 8 of 24 positions shown · non-contrast
Comparison: The present examination has been compared to prior imaging studies.

Images Obtained from [facility]
INDICATION: Screening.
TECHNIQUE: Bilateral 2-D digital screening mammogram was performed followed by 3-D tomosynthesis.  Current study was also evaluated with a computer aided detection (CAD) system.
MAMMOGRAM FINDINGS:
There are scattered areas of fibroglandular density.
No suspicious abnormality is seen in either breast.  There are no significant changes from the prior study.

[R MLO]
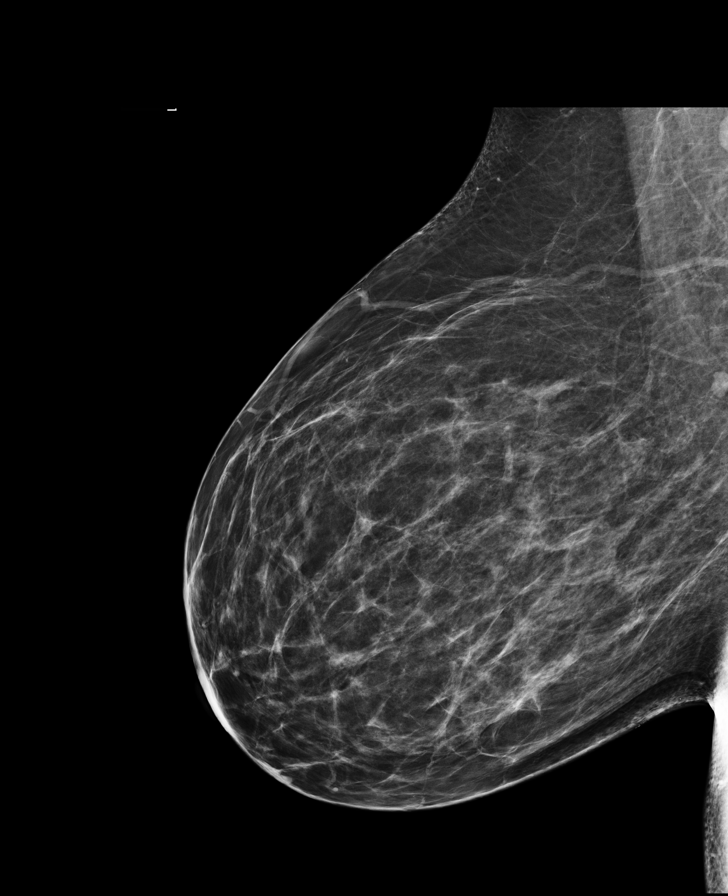

[L CC]
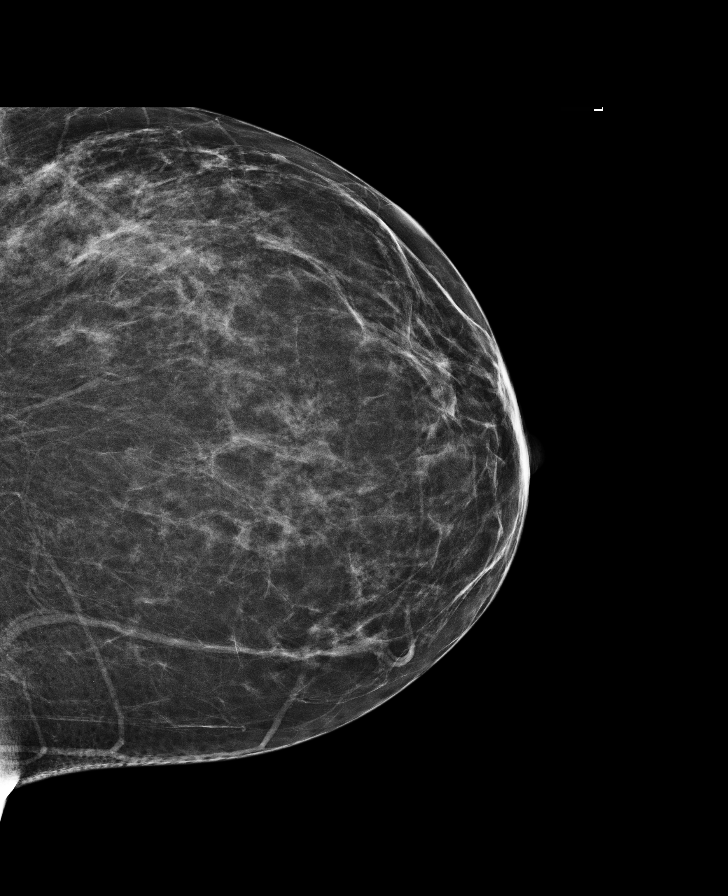

[L MLO]
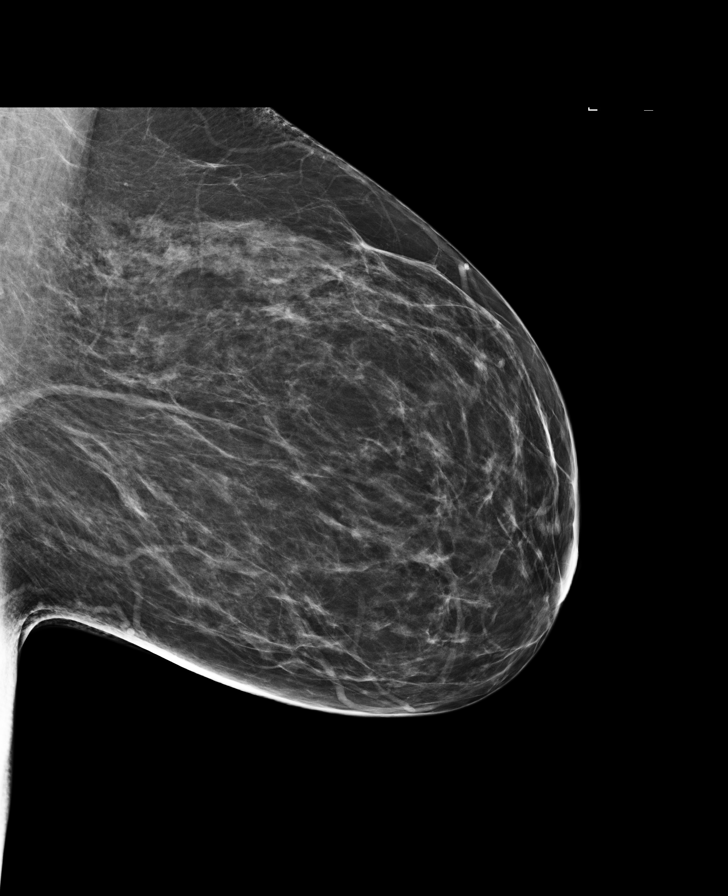

[R CC]
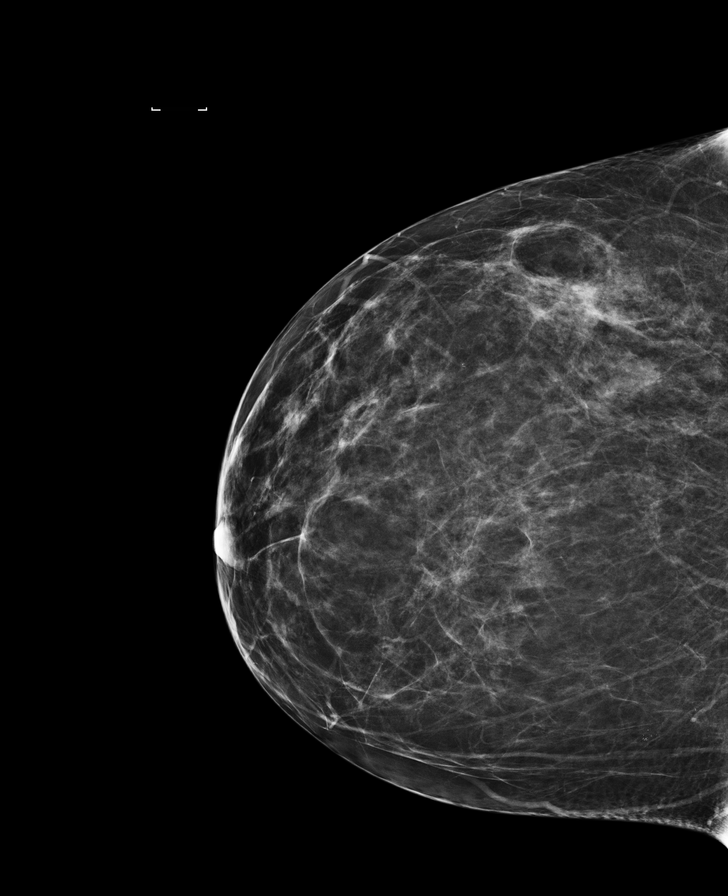

[R CC tomo · tomo slice 34/67.0]
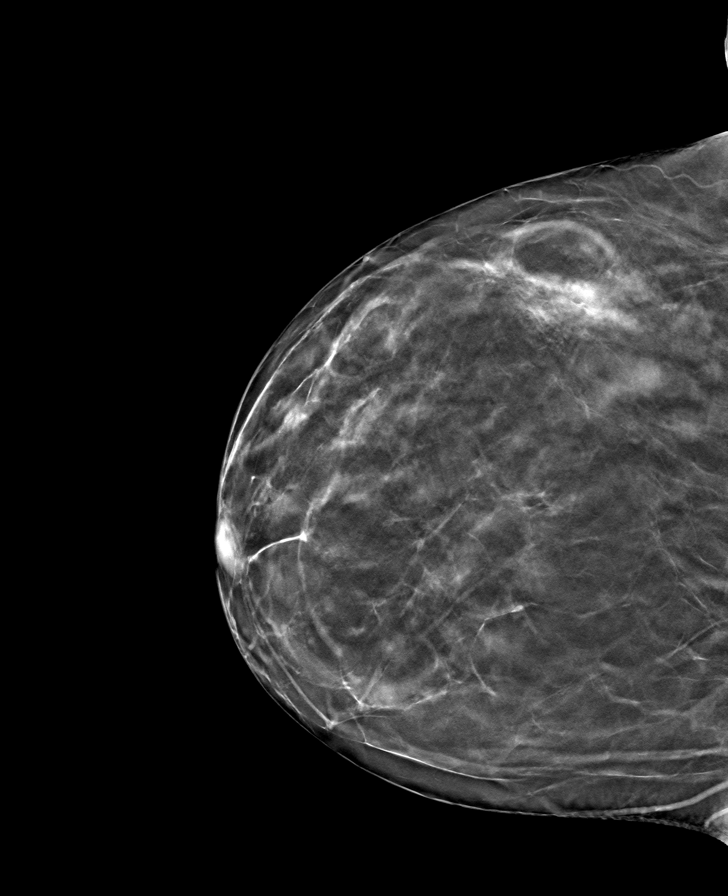

[R MLO tomo · tomo slice 41/81.0]
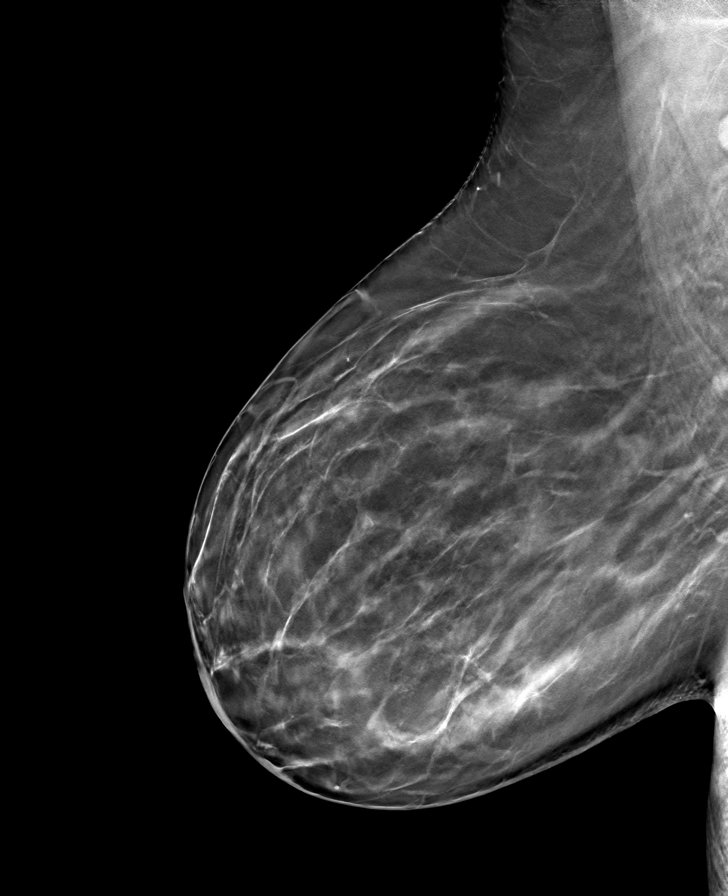

[L CC tomo · tomo slice 33/65.0]
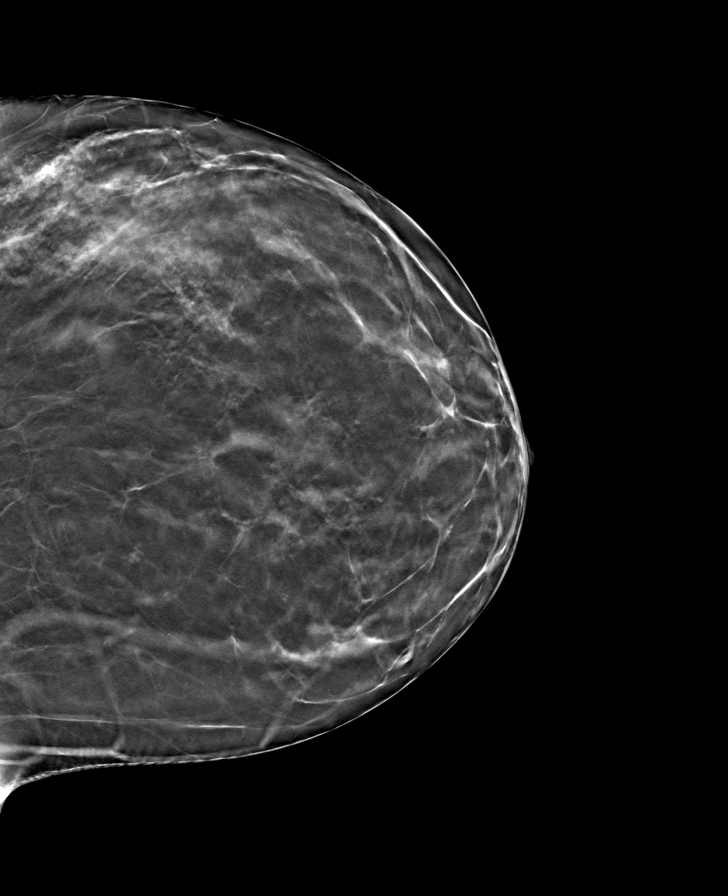

[L MLO tomo · tomo slice 41/81.0]
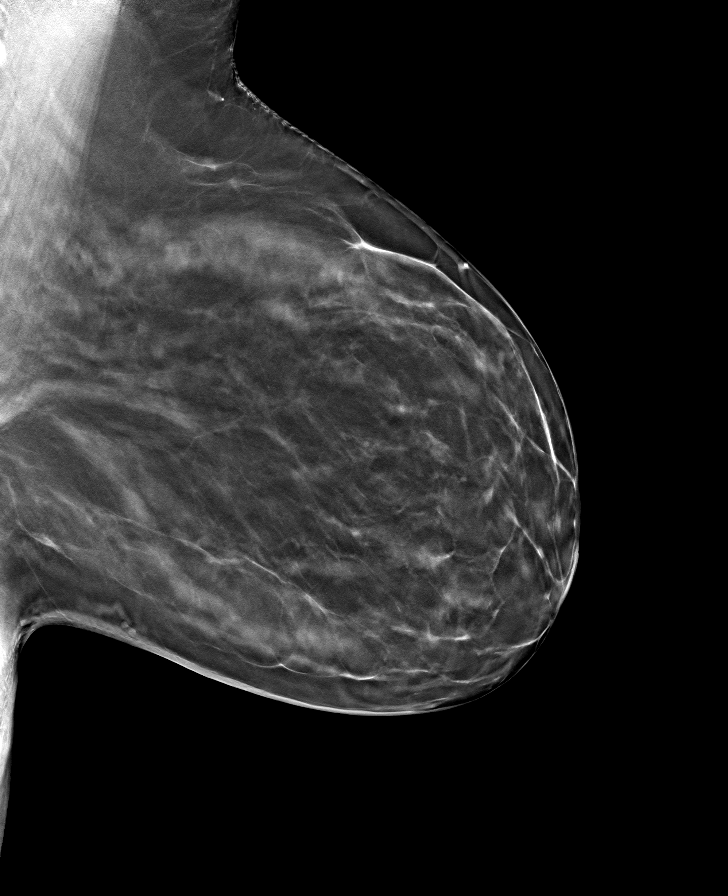

[8 of 24 positions shown; findings below may reference images not displayed]

IMPRESSION: There is no mammographic evidence of malignancy.
Screening mammogram recommended in 1 year.
BI-RADS Category 1: Negative

## 2022-08-23 IMAGING — MR MRI CSPINE WO CONTRAST
4 of 5 series · 26 of 48 positions shown · non-contrast
Comparison: MRI of the cervical spine August 15, 2021

Images Obtained from [facility]
INDICATION: Neck pain with bilateral upper extremity radiculopathy with burning sensation. History of previous surgery 7801
TECHNIQUE: Sagittal multisequence, axial T2 and coronal T1-weighted images of the cervical spine were performed without intravenous contrast.

[Series 5: t2_sag · sagittal · 3.0mm · 0.57mm/px · 5 of 15 slices shown]
[im 1/15]
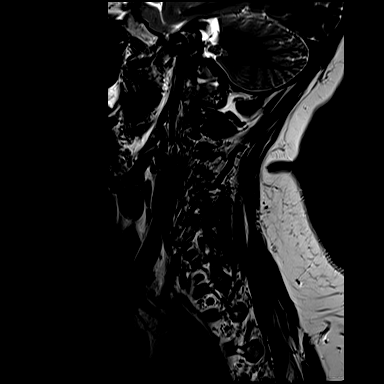
[im 4/15]
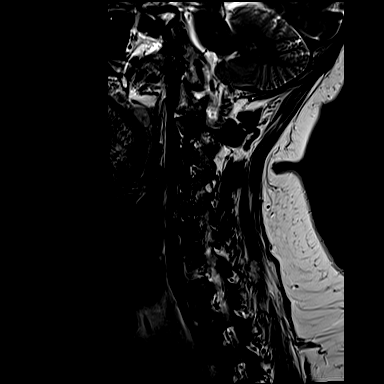
[im 8/15]
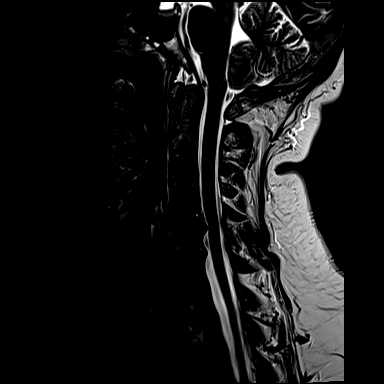
[im 11/15]
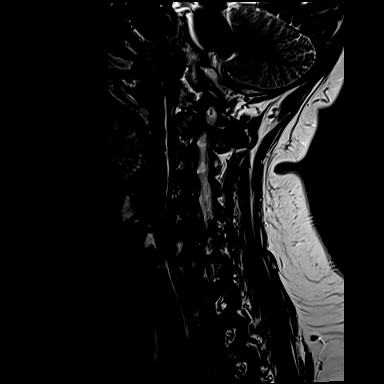
[im 15/15]
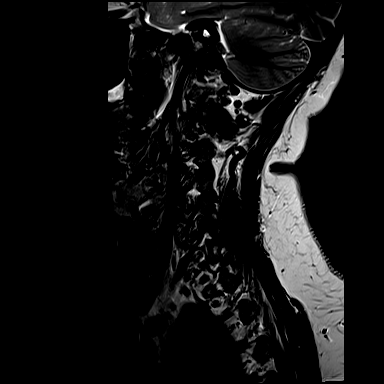

[Series 6: t1_sag · sagittal · 3.0mm · 0.69mm/px · 5 of 15 slices shown]
[im 1/15]
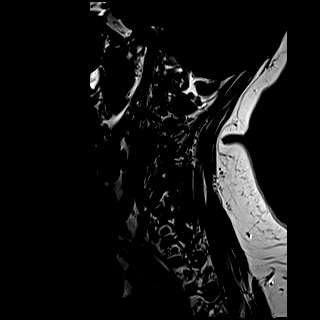
[im 4/15]
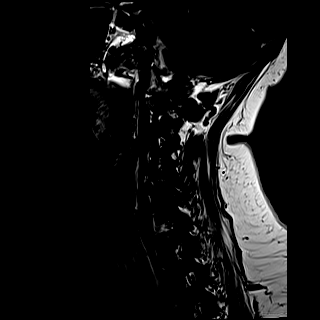
[im 8/15]
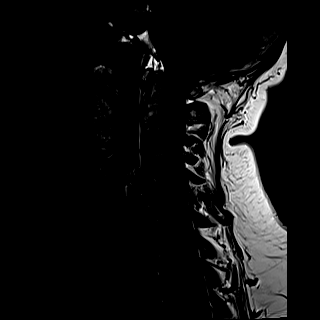
[im 11/15]
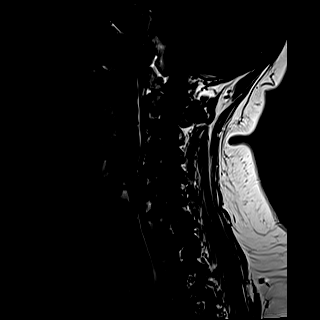
[im 15/15]
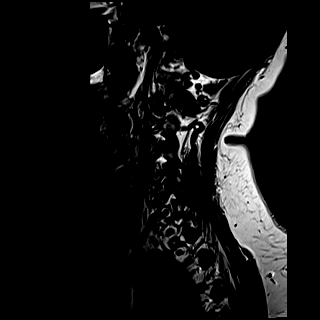

[Series 7: ir_sag · sagittal · 3.0mm · 0.34mm/px · 5 of 15 slices shown]
[im 1/15]
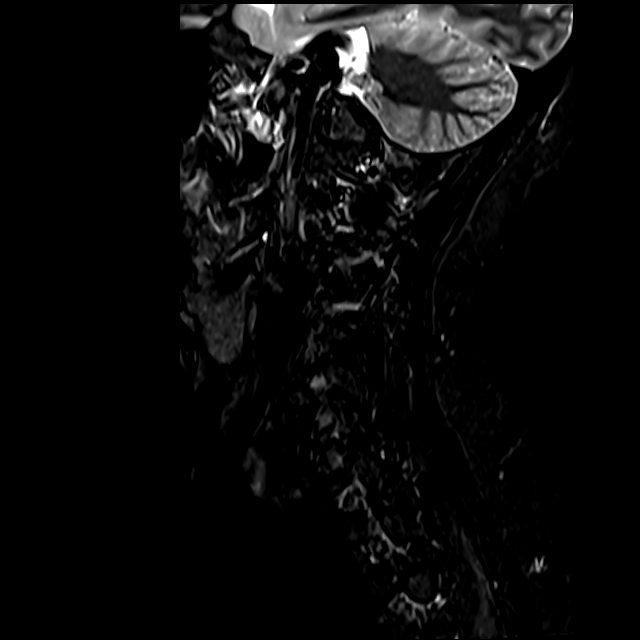
[im 4/15]
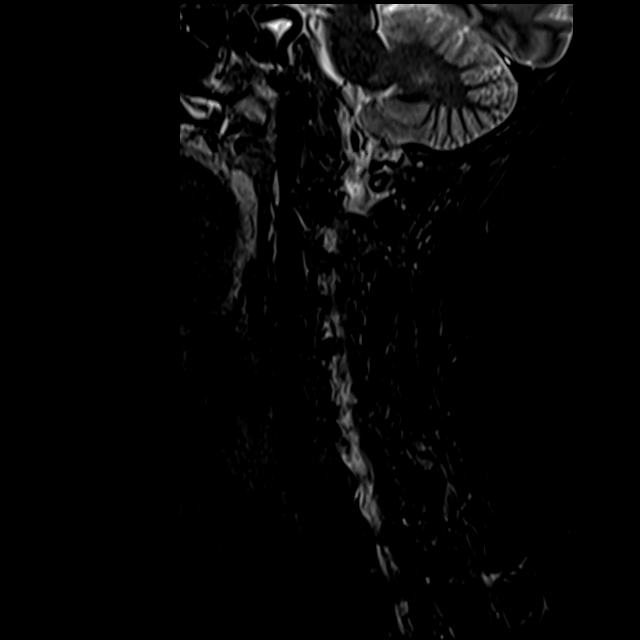
[im 8/15]
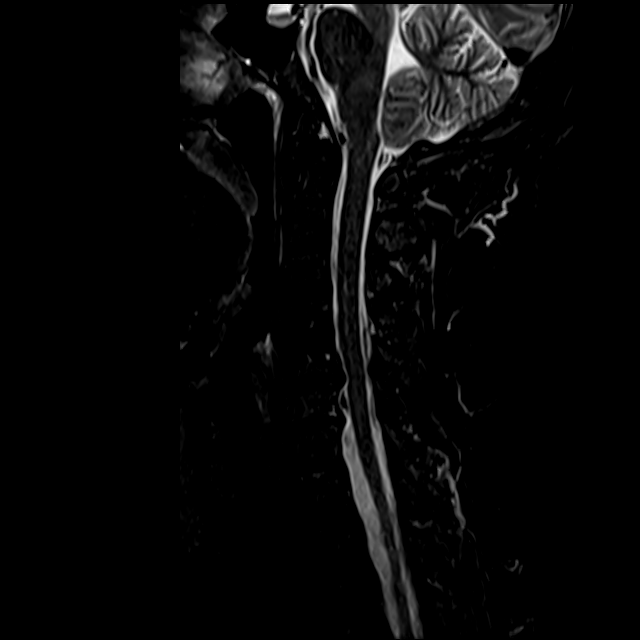
[im 11/15]
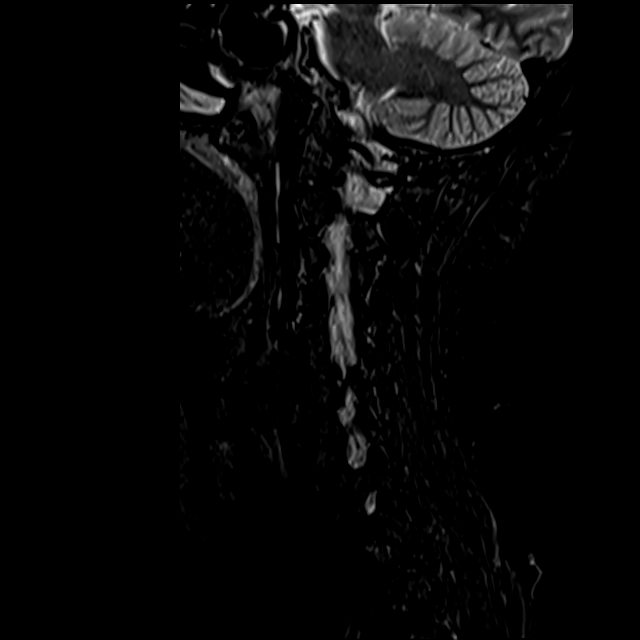
[im 15/15]
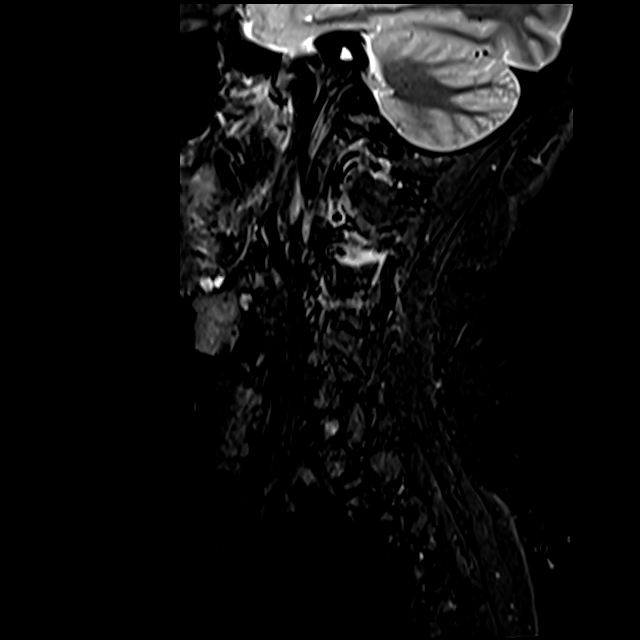

[Series 8: t2_axial · axial · 3.0mm · 0.56mm/px · z∈[-78,+26]mm · 11 of 34 slices shown]
[im 1/34]
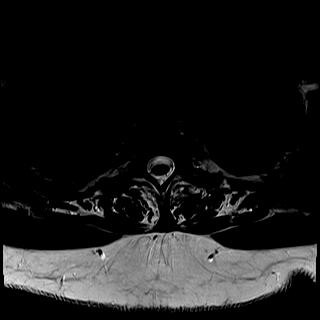
[im 4/34]
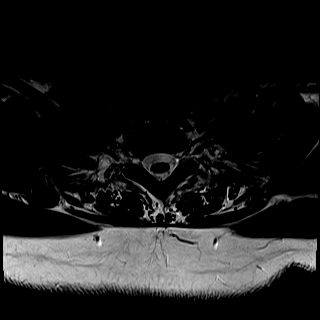
[im 7/34]
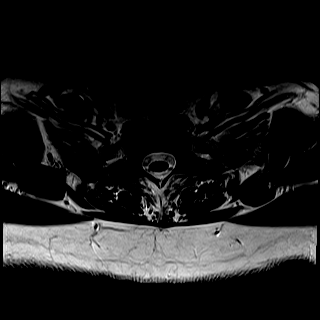
[im 10/34]
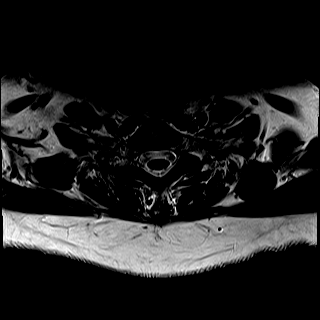
[im 14/34]
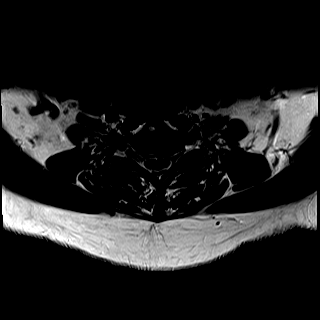
[im 17/34]
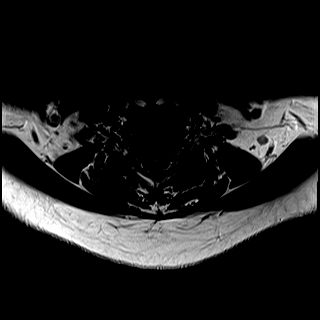
[im 20/34]
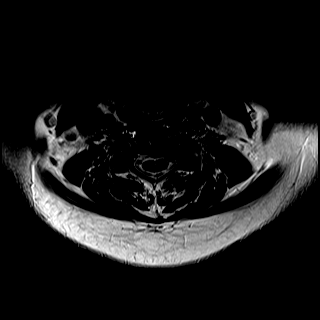
[im 24/34]
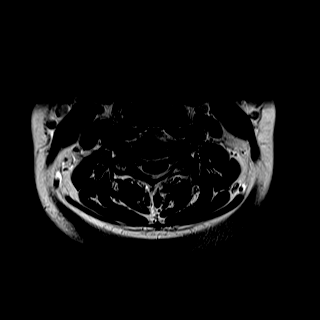
[im 27/34]
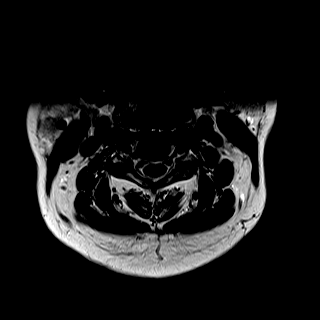
[im 30/34]
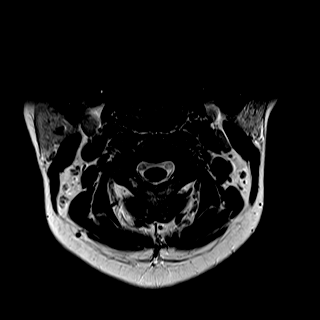
[im 34/34]
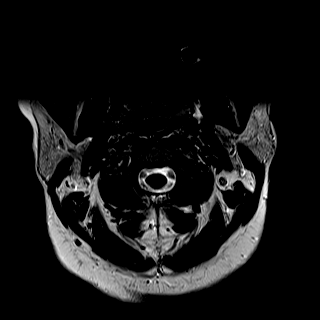

[26 of 48 positions shown; findings below may reference images not displayed]

FINDINGS: There is normal alignment of the vertebral elements with normal relationship between the skull and upper cervical spine. There is normal relationship between the skull and upper cervical
spine. Anterior metallic signal misregistration artifacts are seen at C4-C5 from anterior cervical discectomy and fusion. There is disc desiccation seen at C2-C3, C3-C4 and from C5-C6 down to T2-T3
with decrease in disc space seen at C5-C6 and C6-C7. These findings are in keeping with degenerative disc disease. The signal intensity of the bone marrow, visualized brain, cervical and upper
thoracic neural cord is normal.
At C2-C3 and C3-C4 there is no disc herniation, spinal canal or foraminal stenosis.
At C4-C5 there is status post anterior cervical discectomy and fusion with no spinal canal or foraminal stenosis.
At C5-C6 there is a broad disc bulge with degenerative changes of the right uncovertebral joint producing moderate spinal canal and mild to moderate right foraminal stenoses without significant left
foraminal stenosis.
At C6-C7 there is a disc bulge indenting the dural sac with mild degenerative changes of the left facet and left uncovertebral joint. This produces mild left foraminal stenoses with mild spinal canal
stenoses with mild right foraminal stenosis.
At C6-T1, T1-T2 and T2-T3 there is no disc herniation, spinal canal or foraminal stenosis.
No significant changes seen when compared to the previous examination.
IMPRESSION: Status post anterior cervical discectomy and fusion at C4-C5 with degenerative disc disease, facet arthropathy and degenerative changes of the uncovertebral joints as described. No significant
changes seen when compared to the previous examination.

## 7363-07-17 DEATH — deceased
